# Patient Record
Sex: Female | Born: 1975
Health system: Southern US, Community
[De-identification: ages and names within clinical notes are randomized; demographics above are authoritative.]

## PROBLEM LIST (undated history)

## (undated) DIAGNOSIS — R569 Unspecified convulsions: Principal | ICD-10-CM

## (undated) DIAGNOSIS — Q282 Arteriovenous malformation of cerebral vessels: Secondary | ICD-10-CM

## (undated) DIAGNOSIS — M722 Plantar fascial fibromatosis: Secondary | ICD-10-CM

## (undated) HISTORY — DX: Arteriovenous malformation of cerebral vessels: Q28.2

## (undated) HISTORY — DX: Unspecified convulsions: R56.9

---

## 2001-05-11 ENCOUNTER — Ambulatory Visit (HOSPITAL_COMMUNITY): Admission: RE | Admit: 2001-05-11 | Discharge: 2001-05-11 | Payer: Self-pay | Admitting: Family Medicine

## 2001-06-27 ENCOUNTER — Encounter: Payer: Self-pay | Admitting: Neurology

## 2001-06-27 ENCOUNTER — Ambulatory Visit (HOSPITAL_COMMUNITY): Admission: RE | Admit: 2001-06-27 | Discharge: 2001-06-27 | Payer: Self-pay | Admitting: Neurology

## 2001-08-03 ENCOUNTER — Other Ambulatory Visit: Admission: RE | Admit: 2001-08-03 | Discharge: 2001-08-03 | Payer: Self-pay | Admitting: Family Medicine

## 2001-12-10 ENCOUNTER — Emergency Department (HOSPITAL_COMMUNITY): Admission: EM | Admit: 2001-12-10 | Discharge: 2001-12-10 | Payer: Self-pay | Admitting: Emergency Medicine

## 2002-07-13 HISTORY — PX: OTHER SURGICAL HISTORY: SHX169

## 2002-09-05 ENCOUNTER — Other Ambulatory Visit: Admission: RE | Admit: 2002-09-05 | Discharge: 2002-09-05 | Payer: Self-pay | Admitting: Obstetrics & Gynecology

## 2003-10-02 ENCOUNTER — Other Ambulatory Visit: Admission: RE | Admit: 2003-10-02 | Discharge: 2003-10-02 | Payer: Self-pay | Admitting: Obstetrics & Gynecology

## 2004-07-31 ENCOUNTER — Emergency Department (HOSPITAL_COMMUNITY): Admission: EM | Admit: 2004-07-31 | Discharge: 2004-07-31 | Payer: Self-pay | Admitting: Emergency Medicine

## 2004-09-28 ENCOUNTER — Emergency Department (HOSPITAL_COMMUNITY): Admission: EM | Admit: 2004-09-28 | Discharge: 2004-09-28 | Payer: Self-pay | Admitting: Emergency Medicine

## 2004-10-20 ENCOUNTER — Other Ambulatory Visit: Admission: RE | Admit: 2004-10-20 | Discharge: 2004-10-20 | Payer: Self-pay | Admitting: Obstetrics & Gynecology

## 2012-11-22 ENCOUNTER — Ambulatory Visit (INDEPENDENT_AMBULATORY_CARE_PROVIDER_SITE_OTHER): Payer: BC Managed Care – PPO | Admitting: Family Medicine

## 2012-11-22 ENCOUNTER — Encounter: Payer: Self-pay | Admitting: Family Medicine

## 2012-11-22 ENCOUNTER — Ambulatory Visit (INDEPENDENT_AMBULATORY_CARE_PROVIDER_SITE_OTHER): Payer: BC Managed Care – PPO

## 2012-11-22 VITALS — BP 110/69 | HR 83 | Temp 97.2°F | Ht 64.0 in | Wt 263.0 lb

## 2012-11-22 DIAGNOSIS — M25569 Pain in unspecified knee: Secondary | ICD-10-CM

## 2012-11-22 DIAGNOSIS — M25562 Pain in left knee: Secondary | ICD-10-CM

## 2012-11-22 DIAGNOSIS — R569 Unspecified convulsions: Secondary | ICD-10-CM

## 2012-11-22 NOTE — Patient Instructions (Addendum)
We will get an appointment with Dr. orthopedic to evaluate the left knee We will also get an appointment with the neurologist to evaluate the seizure disorder Only take Tylenol for pain Don't drive until seizure activity is further evaluated

## 2012-11-22 NOTE — Progress Notes (Signed)
Subjective:    Patient ID: Brandy Jefferson, female    DOB: 1976-06-07, 37 y.o.   MRN: [id number]  HPI  Patient has a history of seizures for 12 years or more. She was initially seen by a neurologist  in [place], who referred her to [facility] because of AVMs. According to the patient she had surgery x2. This was in 2002 and 2003. According to the patient she had no more seizures until the past 3 or 4 years. Initially she was having seizures every 2-3 months. Now she is having seizures every one to 5 days. They last for about 3 minutes at a time. They mainly affect her right side. The patient had recent lab work done at work. She is also complaining with left knee problems to the point where she could fall.   Review of Systems  Constitutional: Negative.   Eyes: Negative.   Respiratory: Negative.   Cardiovascular: Negative.   Genitourinary: Negative.   Neurological: Positive for dizziness (occasional dizzy), seizures (recurring, more frequently), numbness (R side) and headaches (once a week). Negative for speech difficulty.       Patient is having right-sided seizures now every one to 5 days lasting as long as 3 minute. She is aware when she is about to have a seizure and post the car over and doesn't drive. I have told her that she should not drive if she is having seizure.       Objective:   Physical Exam  Nursing note and vitals reviewed. Constitutional: She is oriented to person, place, and time. She appears well-developed and well-nourished. No distress.  HENT:  Head: Normocephalic and atraumatic.  Right Ear: External ear normal.  Left Ear: External ear normal.  Eyes: Conjunctivae are normal.  Neck: Normal range of motion. Neck supple. No thyromegaly present.  Cardiovascular: Normal rate, regular rhythm and normal heart sounds.   Pulmonary/Chest: Effort normal and breath sounds normal. No respiratory distress. She has no wheezes. She has no rales.  Abdominal: Soft. She exhibits mass.  She exhibits no distension. There is tenderness. There is no rebound and no guarding.  Obese, slight right lower quadrant tenderness  Musculoskeletal: She exhibits tenderness (bilateral joint lines left knee). She exhibits no edema.  Lymphadenopathy:    She has no cervical adenopathy.  Neurological: She is alert and oriented to person, place, and time. She has normal reflexes.  Skin: Skin is warm and dry.  Psychiatric: She has a normal mood and affect. Her behavior is normal. Judgment and thought content normal.   [facility] reading (PRIMARY) by  Dr. Christell Constant: Left Knee, WNL                                    Assessment & Plan:  1. Seizure-like activity - MR Brain W Wo Contrast - Ambulatory referral to Neurology  2. Left knee pain - DG Knee 1-2 Views Left - Ambulatory referral to Orthopedic Surgery  3. Severe obesity (BMI >= 40)   Patient Instructions  We will get an appointment with Dr. orthopedic to evaluate the left knee We will also get an appointment with the neurologist to evaluate the seizure disorder Only take Tylenol for pain Don't drive until seizure activity is further evaluated   Please bring. by lab work done at work when it becomes available to you. Use moist heat to knee to help control pain until appointment with orthopedist is obtained

## 2012-11-24 ENCOUNTER — Ambulatory Visit
Admission: RE | Admit: 2012-11-24 | Discharge: 2012-11-24 | Disposition: A | Discharge status: Home / Self Care | Payer: BC Managed Care – PPO | Source: Ambulatory Visit | Attending: Family Medicine | Admitting: Family Medicine

## 2012-11-25 ENCOUNTER — Telehealth: Payer: Self-pay | Admitting: *Deleted

## 2012-11-25 NOTE — Telephone Encounter (Signed)
Please do CT scan to confirm whether there is a clip are not and then get MRI

## 2012-11-25 NOTE — Telephone Encounter (Signed)
[facility] called and stated that pt had appt for an MRI of the brain on 5-15. While interviewing patient she stated that she had a clip put in to brain at [facility]. They held off on MRI until they could confirm with [facility]. After receiving records from them there was nothing stated about a clip. Per them pt should have a CT with or without contrast first. Please advise and order if approved.

## 2012-12-06 ENCOUNTER — Telehealth: Payer: Self-pay | Admitting: *Deleted

## 2012-12-06 ENCOUNTER — Telehealth: Payer: Self-pay | Admitting: Family Medicine

## 2012-12-06 NOTE — Telephone Encounter (Signed)
Spoke with Dr Clarisa Kindred office to get ASAP appt

## 2012-12-06 NOTE — Telephone Encounter (Signed)
Please advise kay

## 2012-12-06 NOTE — Telephone Encounter (Signed)
Spoke with Dr Clarisa Kindred nurse to get ASAP appt  Per [facility] radiology Dr Anne Hahn likes to schedule scans so will wait until appt with him

## 2012-12-07 ENCOUNTER — Ambulatory Visit (INDEPENDENT_AMBULATORY_CARE_PROVIDER_SITE_OTHER): Payer: BC Managed Care – PPO | Admitting: Neurology

## 2012-12-07 ENCOUNTER — Encounter: Payer: Self-pay | Admitting: Neurology

## 2012-12-07 VITALS — BP 115/70 | HR 77 | Ht 64.5 in | Wt 262.0 lb

## 2012-12-07 DIAGNOSIS — Z8679 Personal history of other diseases of the circulatory system: Secondary | ICD-10-CM

## 2012-12-07 DIAGNOSIS — R569 Unspecified convulsions: Secondary | ICD-10-CM

## 2012-12-07 DIAGNOSIS — Z8774 Personal history of (corrected) congenital malformations of heart and circulatory system: Secondary | ICD-10-CM | POA: Insufficient documentation

## 2012-12-07 HISTORY — DX: Unspecified convulsions: R56.9

## 2012-12-07 MED ORDER — LEVETIRACETAM 500 MG PO TABS: 500.0000 mg | ORAL_TABLET | Freq: Two times a day (BID) | ORAL | Status: DC

## 2012-12-07 NOTE — Progress Notes (Signed)
Reason for visit: Seizures  Brandy Jefferson is a 37 y.o. female  History of present illness:  Brandy Jefferson is a 37 year old right-handed white female with a history of a left temporal arteriovenous malformation that was ablated after 2 procedures, the last procedure was in 2003. The AVM was ablated through an intervascular procedure. The patient indicates that she is having seizure prior to the ablation, but after the procedure, the seizures went away. The patient was not on any medications for seizures after the procedure. Three or 4 years ago, the patient began having her usual sensory type seizure events involving numbness of the right arm and leg. The patient will have difficulty using the right side for several minutes, and then she will normalize. The patient has never blacked out, but she might become slightly confused. The patient has indicated that the seizures have become much more frequent, and the last seizure has occurred about 10 days ago. The patient still operates a motor vehicle, and she indicates that she will have to pull over to the side of the road for a seizure event. The patient feels normal between the seizures. The patient denies headache or any permanent numbness or weakness of the arms or legs. The patient denies any balance issues. The patient comes to this office for an evaluation.  Past Medical History  Diagnosis Date  . Partial seizure 12/07/2012  . Arteriovenous malformation of brain     Left temporal  . Seizures     Partial seizures, right hemisensory    Past Surgical History  Procedure Laterality Date  . Arteriovenous malformation ablation      Left temporal, intravascular procedure    Family History  Problem Relation Age of Onset  . Cancer Father     Social history:  reports that she has never smoked. She does not have any smokeless tobacco history on file. She reports that  drinks alcohol. Her drug history is not on file.  Medications:  Current  Outpatient Prescriptions on File Prior to Visit  Medication Sig Dispense Refill  . Norgestim-Eth Estrad Triphasic (ORTHO TRI-CYCLEN, 28, PO) Take 1 tablet by mouth daily.       No current facility-administered medications on file prior to visit.    Allergies: No Known Allergies  ROS:  Out of a complete 14 system review of symptoms, the patient complains only of the following symptoms, and all other reviewed systems are negative.  Skin rash, itching Weight gain Easy bruising Seizures  Blood pressure 115/70, pulse 77, height 5' 4.5" (1.638 m), weight 262 lb (118.842 kg).  Physical Exam  General: The patient is alert and cooperative at the time of the examination. The patient is markedly obese.  Head: Pupils are equal, round, and reactive to light. Discs are flat bilaterally.  Neck: The neck is supple, no carotid bruits are noted on the right, a bruit is noted on the left.  Respiratory: The respiratory examination is clear.  Cardiovascular: The cardiovascular examination reveals a regular rate and rhythm, no obvious murmurs or rubs are noted.  Skin: Extremities are without significant edema.  Neurologic Exam  Mental status:  Cranial nerves: Facial symmetry is present. There is good sensation of the face to pinprick and soft touch bilaterally. The strength of the facial muscles and the muscles to head turning and shoulder shrug are normal bilaterally. Speech is well enunciated, no aphasia or dysarthria is noted. Extraocular movements are full. Visual fields are full.  Motor: The motor testing reveals  5 over 5 strength of all 4 extremities. Good symmetric motor tone is noted throughout.  Sensory: Sensory testing is intact to pinprick, soft touch, vibration sensation, and position sense on all 4 extremities. No evidence of extinction is noted.  Coordination: Cerebellar testing reveals good finger-nose-finger and heel-to-shin bilaterally.  Gait and station: Gait is normal.  Tandem gait is normal. Romberg is negative. No drift is seen.  Reflexes: Deep tendon reflexes are symmetric and normal bilaterally. Toes are downgoing bilaterally.   Assessment/Plan:  1. Left temporal AVM, status post ablation  2. Partial seizures, right hemisensory  The patient has had ongoing seizures over the last several years that have become a bit more frequent over time. The patient is not on any antiepileptic medications. The patient has some impairment of the ability to function with the right side during the seizure, and some mild confusion. For this reason, I have recommended that she not drive until further notice. The patient will be set up for a CT scan of the brain with CT angiogram of the head and neck. The patient has a left carotid bruit that could be a high flow bruit, or a possible stenosis. The patient will be placed on Keppra. The patient will be set up for an EEG study. The patient will followup in 3 months.  Marlan Palau MD 37/28/2014 8:18 PM  [facility] [address] [place], [place] [address]  Phone [phone] Fax [phone]

## 2012-12-07 NOTE — Patient Instructions (Signed)
With the keppra, take the medication 500 mg, one half tablet twice a day for one week, then take one tablet twice a day

## 2012-12-19 ENCOUNTER — Institutional Professional Consult (permissible substitution): Payer: BC Managed Care – PPO | Admitting: Neurology

## 2012-12-26 ENCOUNTER — Ambulatory Visit (INDEPENDENT_AMBULATORY_CARE_PROVIDER_SITE_OTHER): Payer: BC Managed Care – PPO

## 2012-12-26 ENCOUNTER — Telehealth: Payer: Self-pay | Admitting: Neurology

## 2012-12-26 DIAGNOSIS — Z8774 Personal history of (corrected) congenital malformations of heart and circulatory system: Secondary | ICD-10-CM

## 2012-12-26 DIAGNOSIS — R569 Unspecified convulsions: Secondary | ICD-10-CM

## 2012-12-26 NOTE — Telephone Encounter (Signed)
I called patient. EEG was unremarkable.

## 2012-12-26 NOTE — Procedures (Signed)
  History:  Brandy Jefferson is a 37 year old patient with a left temporal arteriovenous malformation, status post ablation. The patient has had episodes of numbness sensations of the right arm and leg, and difficulty using the right side for several minutes. The patient is being evaluated for possible seizures.  This is a routine EEG. No skull defects are noted. Medications include Keppra and birth control pills.  EEG classification: Normal awake  Description of the recording: The background rhythms of this recording consists of a fairly well modulated medium amplitude alpha rhythm of 11 Hz that is reactive to eye opening and closure. As the record progresses, the patient appears to remain in the waking state throughout the recording. Photic stimulation was performed, resulting in a bilateral and symmetric photic driving response. Hyperventilation was also performed, resulting in a minimal buildup of the background rhythm activities without significant slowing seen. At no time during the recording does there appear to be evidence of spike or spike wave discharges or evidence of focal slowing. EKG monitor shows no evidence of cardiac rhythm abnormalities with a heart rate of 60.  Impression: This is a normal EEG recording in the waking state. No evidence of ictal or interictal discharges are seen.

## 2013-01-03 DIAGNOSIS — R569 Unspecified convulsions: Secondary | ICD-10-CM

## 2013-01-05 ENCOUNTER — Other Ambulatory Visit: Payer: Self-pay | Admitting: Diagnostic Neuroimaging

## 2013-01-05 DIAGNOSIS — R569 Unspecified convulsions: Secondary | ICD-10-CM

## 2013-01-05 DIAGNOSIS — Z8774 Personal history of (corrected) congenital malformations of heart and circulatory system: Secondary | ICD-10-CM

## 2013-01-06 ENCOUNTER — Telehealth: Payer: Self-pay | Admitting: Neurology

## 2013-01-06 NOTE — Telephone Encounter (Signed)
I called the patient and I left a message. I will call her back. CT the head shows a left anterior temporal AVM. The CTA results are not available.

## 2013-01-09 ENCOUNTER — Telehealth: Payer: Self-pay | Admitting: Neurology

## 2013-01-09 DIAGNOSIS — Q282 Arteriovenous malformation of cerebral vessels: Secondary | ICD-10-CM

## 2013-01-09 NOTE — Telephone Encounter (Signed)
I called patient. The CT of the head shows a moderate to large AVM with a large draining vein. The CT angiogram was not done. I will get an actual catheter angiogram through the hospital.

## 2013-01-27 ENCOUNTER — Ambulatory Visit (HOSPITAL_COMMUNITY)
Admission: RE | Admit: 2013-01-27 | Discharge: 2013-01-27 | Disposition: A | Discharge status: Home / Self Care | Payer: BC Managed Care – PPO | Source: Ambulatory Visit | Attending: Neurology | Admitting: Neurology

## 2013-01-27 DIAGNOSIS — Q282 Arteriovenous malformation of cerebral vessels: Secondary | ICD-10-CM

## 2013-02-10 ENCOUNTER — Telehealth (HOSPITAL_COMMUNITY): Payer: Self-pay | Admitting: Interventional Radiology

## 2013-02-10 ENCOUNTER — Other Ambulatory Visit (HOSPITAL_COMMUNITY): Payer: Self-pay | Admitting: Interventional Radiology

## 2013-02-10 DIAGNOSIS — Q2730 Arteriovenous malformation, site unspecified: Secondary | ICD-10-CM

## 2013-02-10 NOTE — Telephone Encounter (Signed)
Called pt left VM for her to call and schedule cath. Angio. JMichaux

## 2013-02-13 ENCOUNTER — Telehealth (HOSPITAL_COMMUNITY): Payer: Self-pay | Admitting: Interventional Radiology

## 2013-02-13 NOTE — Telephone Encounter (Signed)
Called pt left VM to call back to schedule appt JMichaux

## 2013-02-16 ENCOUNTER — Encounter (HOSPITAL_COMMUNITY): Payer: Self-pay | Admitting: Pharmacy Technician

## 2013-02-17 ENCOUNTER — Other Ambulatory Visit: Payer: Self-pay | Admitting: Radiology

## 2013-02-21 ENCOUNTER — Other Ambulatory Visit (HOSPITAL_COMMUNITY): Payer: Self-pay | Admitting: Interventional Radiology

## 2013-02-21 ENCOUNTER — Encounter (HOSPITAL_COMMUNITY): Payer: Self-pay

## 2013-02-21 ENCOUNTER — Ambulatory Visit (HOSPITAL_COMMUNITY)
Admission: RE | Admit: 2013-02-21 | Discharge: 2013-02-21 | Disposition: A | Discharge status: Home / Self Care | Payer: BC Managed Care – PPO | Source: Ambulatory Visit | Attending: Interventional Radiology | Admitting: Interventional Radiology

## 2013-02-21 VITALS — BP 115/68 | HR 77 | Temp 98.0°F | Resp 18 | Ht 65.0 in | Wt 262.0 lb

## 2013-02-21 DIAGNOSIS — I868 Varicose veins of other specified sites: Secondary | ICD-10-CM | POA: Insufficient documentation

## 2013-02-21 DIAGNOSIS — Q2730 Arteriovenous malformation, site unspecified: Secondary | ICD-10-CM

## 2013-02-21 DIAGNOSIS — Z79899 Other long term (current) drug therapy: Secondary | ICD-10-CM | POA: Insufficient documentation

## 2013-02-21 DIAGNOSIS — Q283 Other malformations of cerebral vessels: Secondary | ICD-10-CM | POA: Insufficient documentation

## 2013-02-21 DIAGNOSIS — R569 Unspecified convulsions: Secondary | ICD-10-CM | POA: Insufficient documentation

## 2013-02-21 LAB — CBC WITH DIFFERENTIAL/PLATELET
Basophils Absolute: 0.1 10*3/uL (ref 0.0–0.1)
Basophils Relative: 1 % (ref 0–1)
Eosinophils Absolute: 0.2 10*3/uL (ref 0.0–0.7)
MCH: 29.8 pg (ref 26.0–34.0)
MCHC: 34.4 g/dL (ref 30.0–36.0)
Monocytes Absolute: 0.5 10*3/uL (ref 0.1–1.0)
Neutro Abs: 3.8 10*3/uL (ref 1.7–7.7)
Neutrophils Relative %: 57 % (ref 43–77)
RDW: 13 % (ref 11.5–15.5)

## 2013-02-21 LAB — PROTIME-INR: INR: 1.02 (ref 0.00–1.49)

## 2013-02-21 LAB — BASIC METABOLIC PANEL
BUN: 10 mg/dL (ref 6–23)
Creatinine, Ser: 0.71 mg/dL (ref 0.50–1.10)
GFR calc Af Amer: >90 mL/min (ref >90)
GFR calc non Af Amer: >90 mL/min (ref >90)

## 2013-02-21 MED ORDER — SODIUM CHLORIDE 0.9 % IV BOLUS (SEPSIS)
INTRAVENOUS | Status: AC | PRN
  Administered 2013-02-21: 200 mL via INTRAVENOUS

## 2013-02-21 MED ORDER — SODIUM CHLORIDE 0.9 % IV SOLN
INTRAVENOUS | Status: AC | PRN
  Administered 2013-02-21: 100 mL/h via INTRAVENOUS

## 2013-02-21 MED ORDER — FENTANYL CITRATE 0.05 MG/ML IJ SOLN
INTRAMUSCULAR | Status: AC
  Filled 2013-02-21: qty 2

## 2013-02-21 MED ORDER — HEPARIN SOD (PORK) LOCK FLUSH 100 UNIT/ML IV SOLN
INTRAVENOUS | Status: AC | PRN
Start: 2013-02-21 — End: 2013-02-21
  Administered 2013-02-21: 500 [IU] via INTRAVENOUS

## 2013-02-21 MED ORDER — MIDAZOLAM HCL 2 MG/2ML IJ SOLN
INTRAMUSCULAR | Status: AC
  Filled 2013-02-21: qty 2

## 2013-02-21 MED ORDER — FENTANYL CITRATE 0.05 MG/ML IJ SOLN
INTRAMUSCULAR | Status: AC | PRN
  Administered 2013-02-21: 12.5 ug via INTRAVENOUS
  Administered 2013-02-21: 25 ug via INTRAVENOUS

## 2013-02-21 MED ORDER — SODIUM CHLORIDE 0.9 % IV SOLN
Freq: Once | INTRAVENOUS | Status: AC
  Administered 2013-02-21: 07:00:00 via INTRAVENOUS

## 2013-02-21 MED ORDER — MIDAZOLAM HCL 2 MG/2ML IJ SOLN
INTRAMUSCULAR | Status: AC | PRN
  Administered 2013-02-21: 0.5 mg via INTRAVENOUS

## 2013-02-21 MED ORDER — IOHEXOL 300 MG/ML  SOLN
150.0000 mL | Freq: Once | INTRAMUSCULAR | Status: AC | PRN
  Administered 2013-02-21: 90 mL via INTRA_ARTERIAL

## 2013-02-21 MED ORDER — SODIUM CHLORIDE 0.9 % IV SOLN: INTRAVENOUS | Status: AC

## 2013-02-21 NOTE — Procedures (Signed)
S/P 4 vesel cerebral arteriogram. LTCFA approach  Findings. 1Larege fast flow LT temporal AVM with arterial feeders from Lt MCA inf division ,and ant temp and Ant choroidal branches ,and from LT PCA  and P3 branches . Venous drainage into Lt int cerebral vein ,vein of Galen,Lt vein of Labbe,and Lt  Cortical veins to ant aspect of sup sagittal sinus. Large venous varix of prox Lt Int cerebral vein

## 2013-02-21 NOTE — H&P (Signed)
Brandy Jefferson is an 37 y.o. female.   Chief Complaint: Pt with Hx Left anterior Arteriovenous malformation Treated 2003 at [facility] with 2 staged embolizations Original sx was seizure activity which led to evaluation showing AVM. After embolizations pt was asymptomatic for yrs. Noticed recurrence of "staring" sz activity approx 2 yrs ago. Would happen very sporadically. Then 2 months ago sxs become more frequent; restarted Keppra and activity is minimal. Recent CT reveals probable recurrence of Left anterior AVM Scheduled now for cerebral arteriogram for evaluation HPI: Arteriovenous malformation; sz  Past Medical History  Diagnosis Date  . Partial seizure 12/07/2012  . Arteriovenous malformation of brain     Left temporal  . Seizures     Partial seizures, right hemisensory    Past Surgical History  Procedure Laterality Date  . Arteriovenous malformation ablation      Left temporal, intravascular procedure    Family History  Problem Relation Age of Onset  . Cancer Father    Social History:  reports that she has never smoked. She does not have any smokeless tobacco history on file. She reports that  drinks alcohol. Her drug history is not on file.  Allergies: No Known Allergies   (Not in a hospital admission)  Results for orders placed during the hospital encounter of 02/21/13 (from the past 48 hour(s))  APTT     Status: None   Collection Time    02/21/13  7:04 AM      Result Value Range   aPTT 26  24 - 37 seconds  CBC WITH DIFFERENTIAL     Status: None   Collection Time    02/21/13  7:04 AM      Result Value Range   WBC 6.6  4.0 - 10.5 K/uL   RBC 4.76  3.87 - 5.11 MIL/uL   Hemoglobin 14.2  12.0 - 15.0 g/dL   HCT 40.9  81.1 - 91.4 %   MCV 86.8  78.0 - 100.0 fL   MCH 29.8  26.0 - 34.0 pg   MCHC 34.4  30.0 - 36.0 g/dL   RDW 78.2  95.6 - 21.3 %   Platelets 323  150 - 400 K/uL   Neutrophils Relative % 57  43 - 77 %   Neutro Abs 3.8  1.7 - 7.7 K/uL   Lymphocytes  Relative 30  12 - 46 %   Lymphs Abs 2.0  0.7 - 4.0 K/uL   Monocytes Relative 8  3 - 12 %   Monocytes Absolute 0.5  0.1 - 1.0 K/uL   Eosinophils Relative 3  0 - 5 %   Eosinophils Absolute 0.2  0.0 - 0.7 K/uL   Basophils Relative 1  0 - 1 %   Basophils Absolute 0.1  0.0 - 0.1 K/uL  PROTIME-INR     Status: None   Collection Time    02/21/13  7:04 AM      Result Value Range   Prothrombin Time 13.2  11.6 - 15.2 seconds   INR 1.02  0.00 - 1.49   No results found.  Review of Systems  Constitutional: Negative for fever and weight loss.  Respiratory: Negative for shortness of breath.   Cardiovascular: Negative for chest pain.  Gastrointestinal: Negative for nausea, vomiting and abdominal pain.  Musculoskeletal: Negative for falls.  Neurological: Positive for seizures. Negative for weakness and headaches.    Blood pressure 127/63, pulse 71, temperature 98 F (36.7 C), temperature source Oral, resp. rate 18, height 5\' 5"  (  1.651 m), weight 262 lb (118.842 kg), last menstrual period 02/09/2013, SpO2 100.00%. Physical Exam  Constitutional: She is oriented to person, place, and time. She appears well-nourished.  Cardiovascular: Normal rate, regular rhythm and normal heart sounds.   No murmur heard. Respiratory: Effort normal and breath sounds normal. She has no wheezes.  GI: Soft. Bowel sounds are normal. There is no tenderness.  Musculoskeletal: Normal range of motion.  Neurological: She is alert and oriented to person, place, and time. Coordination normal.  Skin: Skin is warm and dry.  Psychiatric: She has a normal mood and affect. Her behavior is normal. Judgment and thought content normal.     Assessment/Plan Hx L anterior AVM- post 2 staged embolizations 2003- [facility] sz activity subsided til 2 yrs ago--very sporadic sxs x 2 months more frequent- back on Keppra so seizures are minimal. Recent CT shows prob recurrence of AVM Scheduled now for cerebral arteriogram Pt aware of procedure  benefits and risks and agreeable to proceed Consent signed and in chart  [name] A 02/21/2013, 7:37 AM

## 2013-02-21 NOTE — Progress Notes (Signed)
Bilat. Groin dressings CDI without bleed/hematoma; no c/o

## 2013-02-21 NOTE — ED Notes (Signed)
Dr. Corliss Skains unable to obtain access in right groin, likely due to scar tissue, pressure being held with V-Pad.

## 2013-02-21 NOTE — ED Notes (Signed)
55fr sheath inserted into left groin

## 2013-03-01 ENCOUNTER — Telehealth (HOSPITAL_COMMUNITY): Payer: Self-pay | Admitting: Interventional Radiology

## 2013-03-01 NOTE — Telephone Encounter (Signed)
Pt called to see what her next step would be with Dr. Corliss Skains and to report that her Rt groin was still bruised and has a "knot" in it. I told her I would speak to Dr. Corliss Skains and call her back. She is in agreement with that. JMichaux

## 2013-03-10 ENCOUNTER — Encounter: Payer: Self-pay | Admitting: Nurse Practitioner

## 2013-03-10 ENCOUNTER — Ambulatory Visit (INDEPENDENT_AMBULATORY_CARE_PROVIDER_SITE_OTHER): Payer: BC Managed Care – PPO | Admitting: Nurse Practitioner

## 2013-03-10 VITALS — BP 112/69 | HR 60 | Ht 65.0 in | Wt 260.0 lb

## 2013-03-10 DIAGNOSIS — R569 Unspecified convulsions: Secondary | ICD-10-CM

## 2013-03-10 DIAGNOSIS — Z8774 Personal history of (corrected) congenital malformations of heart and circulatory system: Secondary | ICD-10-CM

## 2013-03-10 DIAGNOSIS — Z8679 Personal history of other diseases of the circulatory system: Secondary | ICD-10-CM

## 2013-03-10 MED ORDER — LEVETIRACETAM 500 MG PO TABS: ORAL_TABLET | ORAL | Status: DC

## 2013-03-10 NOTE — Progress Notes (Signed)
I have read the note, and I agree with the clinical assessment and plan.  Dilyn Smiles KEITH   

## 2013-03-10 NOTE — Patient Instructions (Addendum)
Please increase Keppra to 500mg  am and (2) 500mg  tabs at night.  Continue to keep record of seizure activity Call me in 2 weeks about continued seizure activity F/U in 3 months

## 2013-03-10 NOTE — Progress Notes (Signed)
Reason for visit followup for seizure activity HPI: Brandy Jefferson, 37 yr old returns for followup. She was initially evaluated by Dr. Anne Hahn 12/07/12 for a history of a left temporal AVM that was ablated after 2 procedures The last procedure was in 2003. The AVM was ablated through an intervascular procedure. The patient indicates that she is having seizure prior to the ablation, but after the procedure, the seizures went away. The patient was not on any medications for seizures after the procedure. Three or 4 years ago, the patient began having her usual sensory type seizure events involving numbness of the right arm and leg. The patient will have difficulty using the right side for several minutes, and then she will normalize. The patient has never blacked out, but she might become slightly confused. The patient has indicated that the seizures have become much more frequent, and the last seizure has occurred about 10 days ago. The patient still operates a motor vehicle, and she indicates that she will have to pull over to the side of the road for a seizure event. The patient feels normal between the seizures. The patient denies headache or any permanent numbness or weakness of the arms or legs.   03/10/13; Returns for follow up visit. She has kept a record of her seizure activity and she had 3 in June and July and 4 for August. She has numbness of the right arm and leg, she is aware of her surroundings but cannot communicate, and may be confused. They usually last less than one minute. EEG performed in our office 12/26/12 was normal. She had bil common carotid  and vertebral artery angiogram by Dr.Deveshwar on August 12. She is to be scheduled for repeat MRI she says.   ROS:  Out of  a complete 14 system review, the patient complains only of the following symptoms and all other review of systems are negative. Seizure and headache   Medications Current Outpatient Prescriptions on File Prior to Visit   Medication Sig Dispense Refill  . acetaminophen (TYLENOL) 325 MG tablet Take 325-1,300 mg by mouth every 6 (six) hours as needed for pain.       Lorita Officer Triphasic (ORTHO TRI-CYCLEN, 28, PO) Take 1 tablet by mouth daily.       No current facility-administered medications on file prior to visit.    Allergies No Known Allergies  Physical Exam General: well developed, obese female  seated, in no evident distress Head: head normocephalic and atraumatic. Oropharynx benign Neck: supple with left  carotid  bruit Cardiovascular: regular rate and rhythm, no murmurs  Neurologic Exam Mental Status: Awake and fully alert. Oriented to place and time. Follows all commands. Speech and language normal.   Cranial Nerves:  Pupils equal, briskly reactive to light. Extraocular movements full without nystagmus. Visual fields full to confrontation. Hearing intact and symmetric to finger snap. Facial sensation intact. Face, tongue, palate move normally and symmetrically. Neck flexion and extension normal.  Motor: Normal bulk and tone. Normal strength in all tested extremity muscles.No focal weakness Sensory.: intact to touch and pinprick and vibratory.  Coordination: Rapid alternating movements normal in all extremities. Finger-to-nose and heel-to-shin performed accurately bilaterally. No dysmetria Gait and Station: Arises from chair without difficulty. Stance is normal. Gait demonstrates normal stride length and balance . Able to heel, toe and tandem walk without difficulty.  Reflexes: 2+ and symmetric. Toes downgoing.     ASSESSMENT: Left temporal AVM status post ablation Partial seizures, right hemisensory currently on  Keppra     PLAN: Please increase Keppra to 500mg  am and (2) 500mg  tabs at night.  Continue to keep record of seizure activity Call me in 2 weeks about continued seizure activity F/U in 3 months  Nilda Riggs, GNP-BC APRN

## 2013-03-17 ENCOUNTER — Telehealth (HOSPITAL_COMMUNITY): Payer: Self-pay | Admitting: Interventional Radiology

## 2013-03-17 NOTE — Telephone Encounter (Signed)
Called pt left VM for her to call and schedule consult JMichaux

## 2013-06-14 ENCOUNTER — Encounter: Payer: Self-pay | Admitting: Nurse Practitioner

## 2013-06-14 ENCOUNTER — Ambulatory Visit (INDEPENDENT_AMBULATORY_CARE_PROVIDER_SITE_OTHER): Payer: BC Managed Care – PPO | Admitting: Nurse Practitioner

## 2013-06-14 VITALS — BP 105/60 | HR 65 | Ht 65.0 in | Wt 252.0 lb

## 2013-06-14 DIAGNOSIS — Z8679 Personal history of other diseases of the circulatory system: Secondary | ICD-10-CM

## 2013-06-14 DIAGNOSIS — Z8774 Personal history of (corrected) congenital malformations of heart and circulatory system: Secondary | ICD-10-CM

## 2013-06-14 DIAGNOSIS — R569 Unspecified convulsions: Secondary | ICD-10-CM

## 2013-06-14 MED ORDER — LEVETIRACETAM 500 MG PO TABS: ORAL_TABLET | ORAL | Status: DC

## 2013-06-14 NOTE — Progress Notes (Signed)
I have read the note, and I agree with the clinical assessment and plan.  Alphus Zeck KEITH   

## 2013-06-14 NOTE — Patient Instructions (Signed)
Continue Keppra at current dose will refill for 6 months Call for increase in seizure activity Followup in 6 months

## 2013-06-14 NOTE — Progress Notes (Signed)
[facility]  PATIENT: Brandy Jefferson DOB: November 02, 1975   REASON FOR VISIT: Followup for seizure disorder    HISTORY OF PRESENT ILLNESS:Brandy Jefferson, 37 year old female returns for followup. She was last seen 03/10/2013. At that time she has kept a record of her seizure activity and her Keppra dose was increased to 1500 mg total twice daily. Since that time she has continued to keep a record of her seizure activity. She had 2 seizures in October, her birth control was changed on 04/30/2013 and she has had one seizure since that time. She has not followed up with Dr. Corliss Skains. She denies any side effects to her Keppra.   HISTORY: of a left temporal AVM that was ablated after 2 procedures The last procedure was in 2003. The AVM was ablated through an intervascular procedure. The patient indicates that she is having seizure prior to the ablation, but after the procedure, the seizures went away. The patient was not on any medications for seizures after the procedure. Three or 4 years ago, the patient began having her usual sensory type seizure events involving numbness of the right arm and leg. The patient will have difficulty using the right side for several minutes, and then she will normalize. The patient has never blacked out, but she might become slightly confused. The patient has indicated that the seizures have become much more frequent, and the last seizure has occurred about 10 days ago. The patient still operates a motor vehicle, and she indicates that she will have to pull over to the side of the road for a seizure event. The patient feels normal between the seizures. The patient denies headache or any permanent numbness or weakness of the arms or legs.  03/10/13; Returns for follow up visit. She has kept a record of her seizure activity and she had 3 in June and July and 4 for August. She has numbness of the right arm and leg, she is aware of her surroundings but cannot  communicate, and may be confused. They usually last less than one minute. EEG performed in our office 12/26/12 was normal. She had bil common carotid and vertebral artery angiogram by Dr.Deveshwar on August 12. She is to be scheduled for repeat MRI she says.    REVIEW OF SYSTEMS: Full 14 system review of systems performed and notable only for:  Constitutional: N/A  Cardiovascular: N/A  Ear/Nose/Throat: N/A  Skin: N/A  Eyes: N/A  Respiratory: N/A  Gastroitestinal: N/A  Hematology/Lymphatic: N/A  Endocrine: N/A Musculoskeletal:N/A  Allergy/Immunology: N/A  Neurological: Seizure Psychiatric: N/A   ALLERGIES: No Known Allergies  HOME MEDICATIONS: Outpatient Prescriptions Prior to Visit  Medication Sig Dispense Refill  . acetaminophen (TYLENOL) 325 MG tablet Take 325-1,300 mg by mouth every 6 (six) hours as needed for pain.       Marland Kitchen levETIRAcetam (KEPPRA) 500 MG tablet 1 am and 2 at hs  90 tablet  3  . Norgestim-Eth Estrad Triphasic (ORTHO TRI-CYCLEN, 28, PO) Take 1 tablet by mouth daily.       No facility-administered medications prior to visit.    PAST MEDICAL HISTORY: Past Medical History  Diagnosis Date  . Partial seizure 12/07/2012  . Arteriovenous malformation of brain     Left temporal  . Seizures     Partial seizures, right hemisensory    PAST SURGICAL HISTORY: Past Surgical History  Procedure Laterality Date  . Arteriovenous malformation ablation      Left temporal, intravascular procedure    FAMILY  HISTORY: Family History  Problem Relation Age of Onset  . Cancer Father     SOCIAL HISTORY: History   Social History  . Marital Status: Single    Spouse Name: N/A    Number of Children: 1  . Years of Education: 12   Occupational History  .  [employer]   Social History Main Topics  . Smoking status: Never Smoker   . Smokeless tobacco: Never Used  . Alcohol Use: Yes     Comment: Occasional  . Drug Use: No  . Sexual Activity: Not on file    Other Topics Concern  . Not on file   Social History Narrative   Patient lives at home with daughter and her daughters dad.    Patient is not married.    Patient has 1 child.    Patient is currently working.    Patient drinks caffeine and the amounts varies.            PHYSICAL EXAM  Filed Vitals:   06/14/13 1027  BP: 105/60  Pulse: 65  Height: 5\' 5"  (1.651 m)  Weight: 252 lb (114.306 kg)   Body mass index is 41.93 kg/(m^2).  Generalized: Well developed, in no acute distress  Head: normocephalic and atraumatic,. Oropharynx benign  Neck: Supple, no carotid bruits  Cardiac: Regular rate rhythm, no murmur  Neurological examination   Mentation: Alert oriented to time, place, history taking. Follows all commands speech and language fluent  Cranial nerve II-XII: Pupils were equal round reactive to light extraocular movements were full, visual field were full on confrontational test. Facial sensation and strength were normal. hearing was intact to finger rubbing bilaterally. Uvula tongue midline. head turning and shoulder shrug and were normal and symmetric.Tongue protrusion into cheek strength was normal. Motor: normal bulk and tone, full strength in the BUE, BLE, fine finger movements normal, no pronator drift. No focal weakness Sensory: normal and symmetric to light touch, pinprick, and  vibration  Coordination: finger-nose-finger, heel-to-shin bilaterally, no dysmetria Reflexes: Brachioradialis 2/2, biceps 2/2, triceps 2/2, patellar 2/2, Achilles 2/2, plantar responses were flexor bilaterally. Gait and Station: Rising up from seated position without assistance, normal stance, moderate stride, good arm swing, smooth turning, able to perform tiptoe, and heel walking without difficulty.   DIAGNOSTIC DATA (LABS, IMAGING, TESTING) - I reviewed patient records, labs, notes, testing and imaging myself where available.  Lab Results  Component Value Date   WBC 6.6 02/21/2013    HGB 14.2 02/21/2013   HCT 41.3 02/21/2013   MCV 86.8 02/21/2013   PLT 323 02/21/2013      Component Value Date/Time   NA 139 02/21/2013 0704   K 3.6 02/21/2013 0704   CL 105 02/21/2013 0704   CO2 25 02/21/2013 0704   GLUCOSE 92 02/21/2013 0704   BUN 10 02/21/2013 0704   CREATININE 0.71 02/21/2013 0704   CALCIUM 9.0 02/21/2013 0704   GFRNONAA >90 02/21/2013 0704   GFRAA >90 02/21/2013 0704     ASSESSMENT AND PLAN  37 y.o. year old female  has a past medical history of Partial seizure (12/07/2012); Arteriovenous malformation of brain; and Seizures. here for followup. She has kept a record of her seizures. She had a change in her birth control on 04/30/13 with 1 seizure in 7 weeks which is an improvement. She is currently on Keppra 1500 mg total dose. She has not called for her followup appointment with Dr. Corliss Skains.  Continue Keppra at current dose will refill for 6  months Call for increase in seizure activity Followup in 6 months Nilda Riggs, [facility], [facility], APRN  [facility] [address] [place], [place] [address] [phone]

## 2013-07-19 ENCOUNTER — Other Ambulatory Visit: Payer: Self-pay | Admitting: Nurse Practitioner

## 2013-07-19 MED ORDER — OSELTAMIVIR PHOSPHATE 75 MG PO CAPS: 75.0000 mg | ORAL_CAPSULE | Freq: Every day | ORAL | Status: DC

## 2013-09-11 IMAGING — XA IR ANGIO INTRA EXTRACRAN SEL COM CAROTID INNOMINATE BILAT MOD SE
2 of 4 series · 11 of 24 positions shown · IV contrast (IODINE)
Comparison: Catheter angiogram of 06/27/2001.

CLINICAL DATA: History of left cerebral hemispheric AM first
diagnosed in 9440.  Subsequently treated with multiple
embolizations.  The patient presents with headaches and increased
frequency of seizures.

BILATERAL COMMON CAROTID ARTERY AND BILATERAL VERTEBRAL ARTERY
ANGIOGRAMS

[Series 7: vertebral 1 · 1 of 4 slices shown]
[im 1/4]
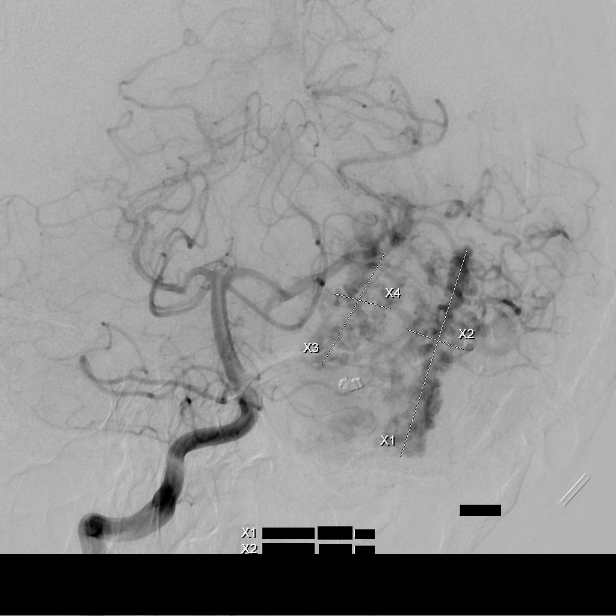

[Series 300: neuro · 10 of 203 slices shown]
[im 1/203]
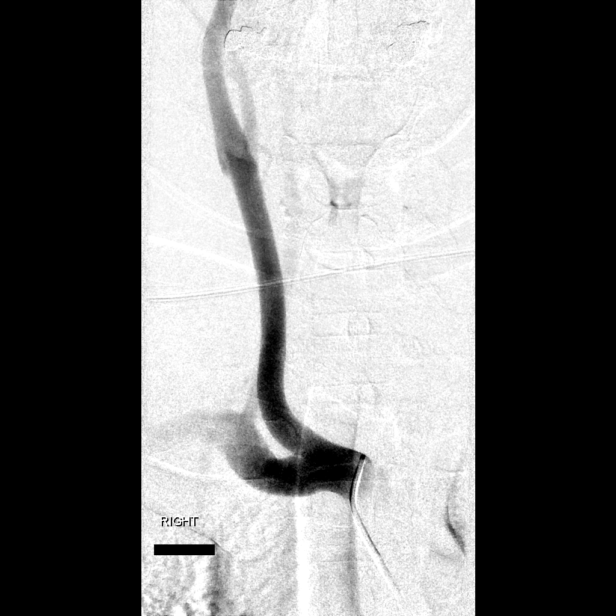
[im 21/203]
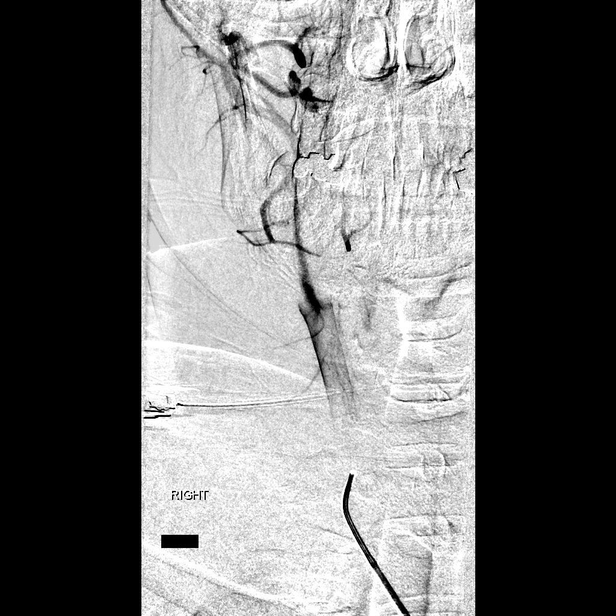
[im 41/203]
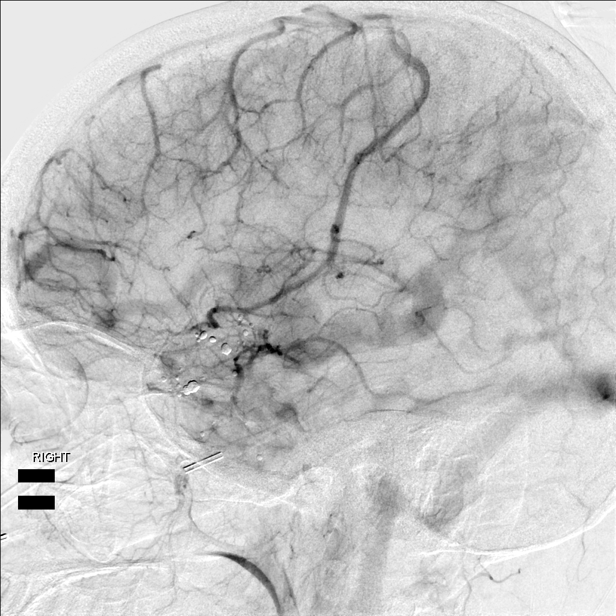
[im 61/203]
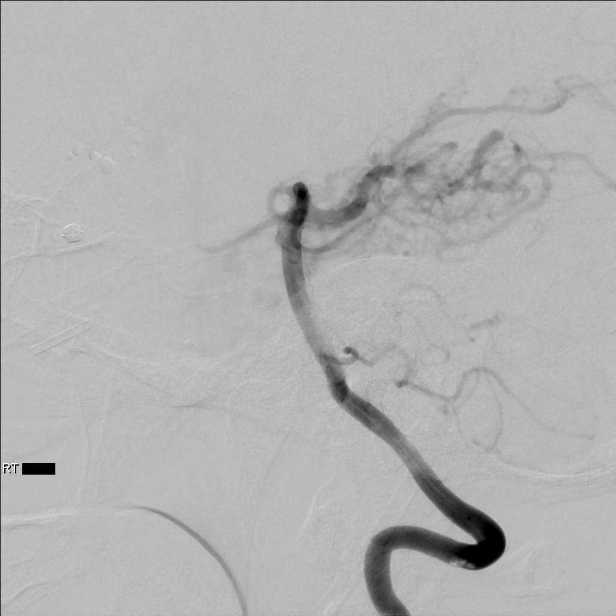
[im 91/203]
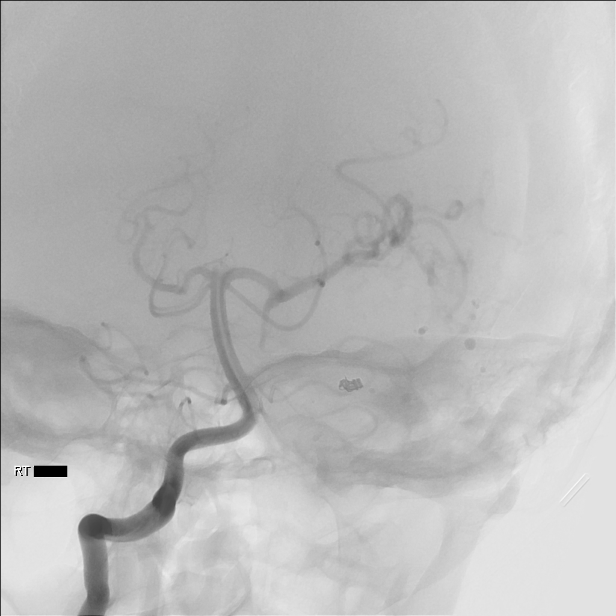
[im 112/203]
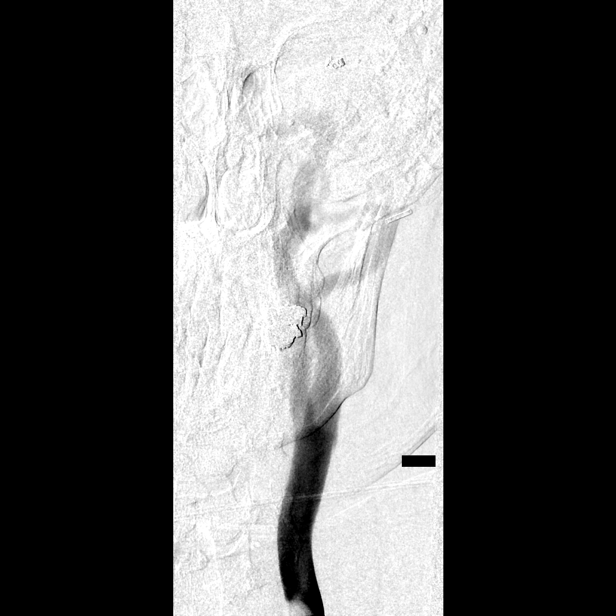
[im 132/203]
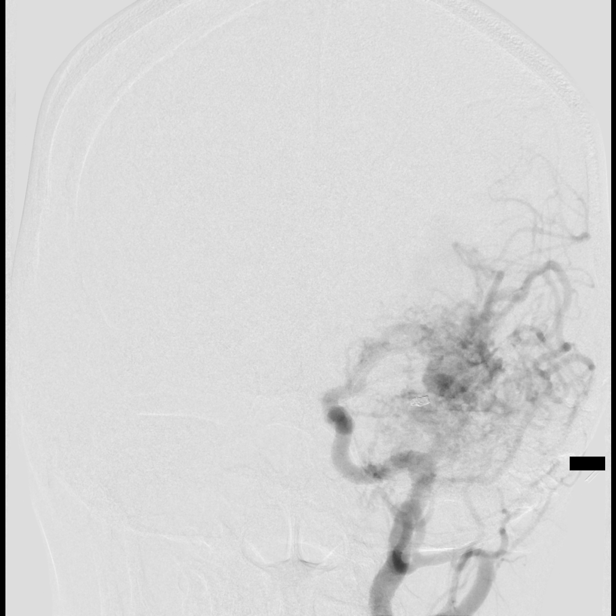
[im 152/203]
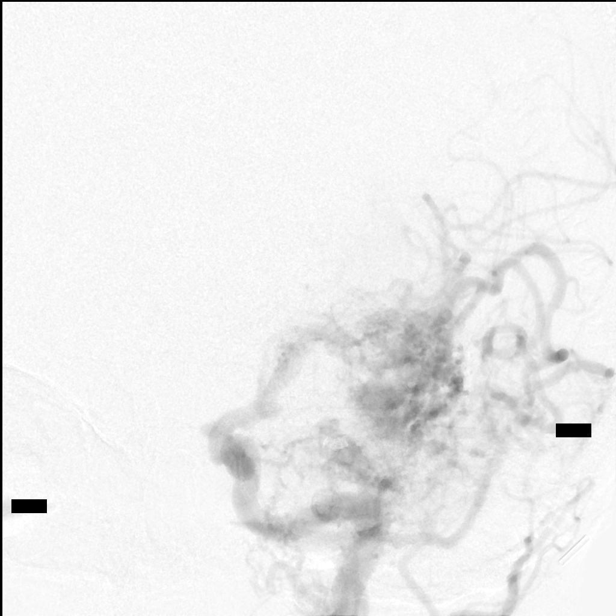
[im 172/203]
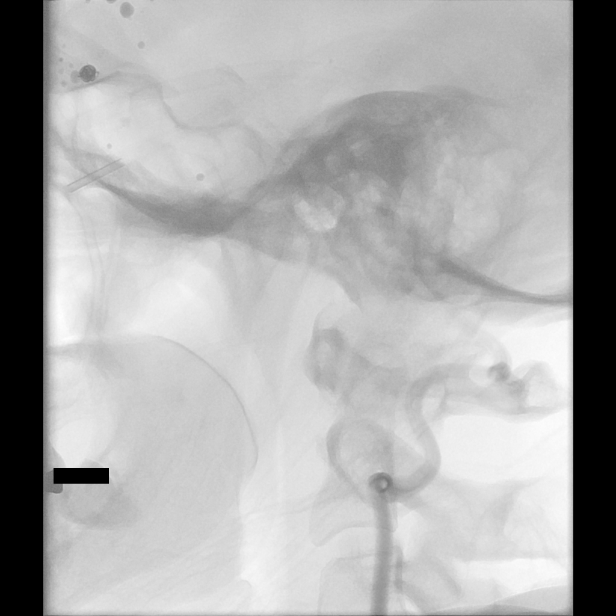
[im 192/203]
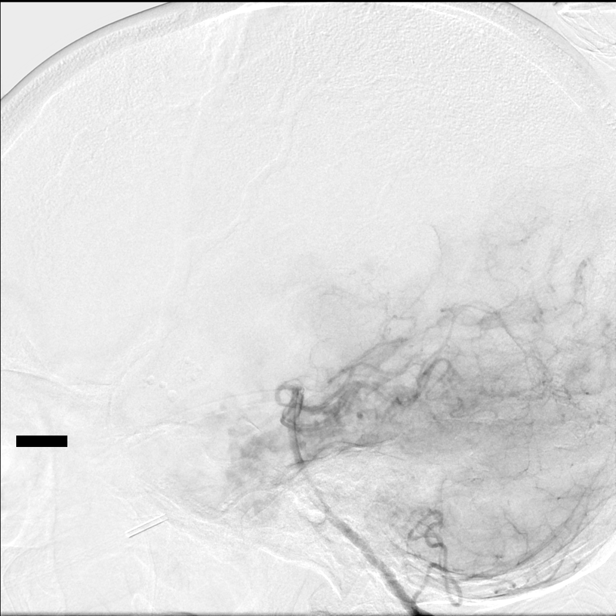

[11 of 24 positions shown; findings below may reference images not displayed]

Following a full explanation of the procedure along with the
potential associated complications, an informed witnessed consent
was obtained.

The left groin was prepped and draped in the usual sterile fashion.
Thereafter using modified Seldinger technique, transfemoral access
into the left common carotid artery was obtained without
difficulty.  Over a 0.035" guidewire, a 5-French Pinnacle sheath
was inserted.  Through this and also over a 0.035" guidewire, a 5-
French JB1 catheter was advanced to the aortic arch region and
selectively positioned in the right common carotid artery, the
right vertebral artery, the left common carotid artery and the left
vertebral artery.

There were no acute complications.  The patient tolerated the
procedure well.

Medications utilized:  Versed 0.5 mg IV.  Fentanyl 37.5 mcg.

Contrast utilized:  Hmnipaque-P22 approximately 55 ml.
FINDINGS: The right common carotid arteriogram demonstrates the
right external carotid artery and its major branches to be normal.

The right internal artery at the bulb to the cranial skull base
opacifies normally.

The petrous, the cavernous and the supraclinoid segments are
normal.  The right middle and the right anterior cerebral arteries
opacify normally into the capillary and venous phases.

Prompt opacification via the anterior communicating artery of the
left anterior cerebral artery and of the left middle cerebral
artery retrogradely is seen.  There is almost immediate
opacification of the left temporal nidus of an arteriovenous
malformation with subsequent opacification of venous drainage
superiorly and medially and subsequently into the left internal
cerebral vein to be described in more detail later.

The right vertebral artery origin is normal.

The vessel opacifies normally to the cranial skull base.

There is normal opacification of the right posterior inferior
cerebellar artery and the right vertebrobasilar junction.

The basilar artery, the right posterior cerebral artery, the
superior cerebellar arteries and the anterior inferior cerebellar
arteries opacify normally into the capillary and venous phases.
Unopacified blood is seen in the basilar artery from the
contralateral vertebral artery.

An abnormally prominent left posterior cerebral artery is seen with
subsequent opacification from the left P3 arterial branches of a
prominent nidus involving the left temporal lobe which extends
anteroposteriorly and mediolaterally.

The nidus on this injection measures approximately 4.6 cm by
cm.

This is subsequently followed by the opacification anteriorly from
the nidus and laterally of a single vein along the cortical surface
supplying the superior sagittal sinus, a prominent vein along the
midsegment of the nidus projecting superiorly, and associated with
a large venous varix.  There is subsequent opacification into the
prominent left internal cerebral vein, the vein of Oldis Durres and the
straight sinus.

Also demonstrated is opacification into the left vein of Labbe.

The left common carotid arteriogram demonstrates the left external
carotid artery and its major branches to be normal.

The left internal carotid artery at the bulb to the cranial skull
base opacifies normally.

The petrous, cavernous and the supraclinoid segments are normal.

A prominent anterior choroidal artery is seen opacifying the
inferior aspect of a nidus of the midportion of that the temporal
arteriovenous malformation.

There is no visualization of the left anterior cerebral.

Opacification is seen of the left middle cerebral artery with
abnormal prominence of the inferior division and the anterior
temporal branch which give rise to prominent arterial feeders
supplying the lateral and the superior aspect of the nidus of the
arteriovenous malformation.

The superior division of the left middle cerebral artery appears
hypoplastic on account of the shunting into the nidus.

Again demonstrated is the prominent venous flow into the venous
varix along the superior aspect of the nidus of the arteriovenous
malformation with subsequent opacification medially and centrally
into a large left internal cerebral vein and subsequently the vein
of Oldis Durres and the straight sinus.

Also seen is opacification from the inferior aspect of the nidus
along the cortical surface into the vein of Labbe.  A prominent
venous channel is again noted along the anterior lateral aspect of
the arteriovenous malformation into the anterior third of the
superior sagittal sinus.

The large venous varix measures approximately 3 cm by 18 mm.

The nidus on the lateral projection measures 4.8 cm x 2.9 cm, and
on the AP projection measures approximately 3.9 cm x 4.6 cm.

The left vertebral artery origin is normal.  The vessel opacifies
normally to the cranial skull base.

There is normal opacification of the left posterior inferior
cerebellar artery and the left vertebrobasilar junction.

The opacified portion of the basilar artery, the right posterior
cerebral artery, the superior cerebellar arteries and the anterior
inferior cerebellar arteries is normally into capillary and venous
phases.

Transient opacification of a prominent left posterior cerebral
artery is seen with opacification of the nidus of the arteriovenous
malformation in the left temporal lobe region, from the left PCA P3
arterial branches.

IMPRESSION
1.  Large fast flow left temporal lobe arteriovenous malformation
with a nidus measuring 4.8 cm x 2.9 cm in the lateral projection,
and 3.9 cm by 4.5 cm in the AP projection.
2.  Primary arterial features from the left posterior cerebral
artery P3 branches, and the anterior temporal branch, and the
inferior division of the left middle cerebral artery, and to a
lesser degree from the left anterior choroidal artery.
3.  Primary venous outflow via a large vein emanating from the
superior aspect of the nidus of the arteriovenous malformation
associated with a large venous varix with subsequent opacification
into a large left internal cerebral vein, the vein of Oldis Durres and
subsequently the straight sinus, inferiorly via a prominent branch
feeding into the vein of Labbe on the left, and anteriorly and
superiorly via a cortical branch draining into the anterior third
of the superior sagittal sinus.
4.  No associated perinidal, intranidal or intracranial aneurysms
seen on this study.
5.  Compared to the study of [DATE], there is suggestion of a
patchy mild decrease in the nidus of the arteriovenous
malformation, with however associated increased prominence of the
venous varix along the superior mid segment of the arteriovenous
malformation as described above.

## 2013-12-13 ENCOUNTER — Encounter (INDEPENDENT_AMBULATORY_CARE_PROVIDER_SITE_OTHER): Payer: Self-pay

## 2013-12-13 ENCOUNTER — Encounter: Payer: Self-pay | Admitting: Nurse Practitioner

## 2013-12-13 ENCOUNTER — Ambulatory Visit (INDEPENDENT_AMBULATORY_CARE_PROVIDER_SITE_OTHER): Payer: BC Managed Care – PPO | Admitting: Nurse Practitioner

## 2013-12-13 VITALS — BP 103/66 | HR 56 | Ht 64.5 in | Wt 243.0 lb

## 2013-12-13 DIAGNOSIS — R569 Unspecified convulsions: Secondary | ICD-10-CM

## 2013-12-13 DIAGNOSIS — Z8774 Personal history of (corrected) congenital malformations of heart and circulatory system: Secondary | ICD-10-CM

## 2013-12-13 DIAGNOSIS — Z8679 Personal history of other diseases of the circulatory system: Secondary | ICD-10-CM

## 2013-12-13 MED ORDER — LEVETIRACETAM 500 MG PO TABS: ORAL_TABLET | ORAL | Status: DC

## 2013-12-13 NOTE — Progress Notes (Signed)
[facility]  PATIENT: Brandy Jefferson DOB: 09-04-1975   REASON FOR VISIT: Followup for seizure disorder   HISTORY OF PRESENT ILLNESS: Brandy Jefferson, 38 year old female returns for followup. She was last seen in the office 06/14/2013. She has history of seizure disorder. Last seizure activity was 04/30/2013. She is currently on Keppra 1500 mg total twice daily. She denies any side effects to her Keppra. She returns for reevaluation.   HISTORY: of a left temporal AVM that was ablated after 2 procedures The last procedure was in 2003. The AVM was ablated through an intervascular procedure. The patient indicates that she is having seizure prior to the ablation, but after the procedure, the seizures went away. The patient was not on any medications for seizures after the procedure. Three or 4 years ago, the patient began having her usual sensory type seizure events involving numbness of the right arm and leg. The patient will have difficulty using the right side for several minutes, and then she will normalize. The patient has never blacked out, but she might become slightly confused. The patient has indicated that the seizures have become much more frequent, and the last seizure has occurred about 10 days ago. The patient still operates a motor vehicle, and she indicates that she will have to pull over to the side of the road for a seizure event. The patient feels normal between the seizures. The patient denies headache or any permanent numbness or weakness of the arms or legs.  03/10/13; Returns for follow up visit. She has kept a record of her seizure activity and she had 3 in June and July and 4 for August. She has numbness of the right arm and leg, she is aware of her surroundings but cannot communicate, and may be confused. They usually last less than one minute. EEG performed in our office 12/26/12 was normal. She had bil common carotid and vertebral artery angiogram by Dr.Deveshwar  on August 12. She is to be scheduled for repeat MRI she says.    REVIEW OF SYSTEMS: Full 14 system review of systems performed and notable only for those listed, all others are neg:  Constitutional: N/A  Cardiovascular: N/A  Ear/Nose/Throat: N/A  Skin: N/A  Eyes: N/A  Respiratory: N/A  Gastroitestinal: N/A  Hematology/Lymphatic: N/A  Endocrine: N/A Musculoskeletal: Knee pain , arthritis Allergy/Immunology: N/A  Neurological: N/A Psychiatric: N/A Sleep : NA   ALLERGIES: No Known Allergies  HOME MEDICATIONS: Outpatient Prescriptions Prior to Visit  Medication Sig Dispense Refill  . acetaminophen (TYLENOL) 325 MG tablet Take 325-1,300 mg by mouth every 6 (six) hours as needed for pain.       Marland Kitchen. levETIRAcetam (KEPPRA) 500 MG tablet 1 am and 2 at hs  90 tablet  6  . norgestimate-ethinyl estradiol (ORTHO-CYCLEN,SPRINTEC,PREVIFEM) 0.25-35 MG-MCG tablet Take 1 tablet by mouth daily.      Marland Kitchen. oseltamivir (TAMIFLU) 75 MG capsule Take 1 capsule (75 mg total) by mouth daily.  10 capsule  0   No facility-administered medications prior to visit.    PAST MEDICAL HISTORY: Past Medical History  Diagnosis Date  . Partial seizure 12/07/2012  . Arteriovenous malformation of brain     Left temporal  . Seizures     Partial seizures, right hemisensory    PAST SURGICAL HISTORY: Past Surgical History  Procedure Laterality Date  . Arteriovenous malformation ablation      Left temporal, intravascular procedure    FAMILY HISTORY: Family History  Problem Relation Age of  Onset  . Cancer Father     SOCIAL HISTORY: History   Social History  . Marital Status: Single    Spouse Name: N/A    Number of Children: 1  . Years of Education: 12   Occupational History  .  [employer]   Social History Main Topics  . Smoking status: Never Smoker   . Smokeless tobacco: Never Used  . Alcohol Use: Yes     Comment: Occasional  . Drug Use: No  . Sexual Activity: Not on file   Other Topics  Concern  . Not on file   Social History Narrative   Patient lives at home with daughter and her daughters dad.    Patient is not married.    Patient has 1 child.    Patient is currently working.    Patient drinks caffeine and the amounts varies.            PHYSICAL EXAM  Filed Vitals:   12/13/13 1405  BP: 103/66  Pulse: 56  Height: 5' 4.5" (1.638 m)  Weight: 243 lb (110.224 kg)   Body mass index is 41.08 kg/(m^2).  Generalized: Well developed, obese female in no acute distress    Neurological examination   Mentation: Alert oriented to time, place, history taking. Follows all commands speech and language fluent Cranial nerve II-XII: Pupils were equal round reactive to light extraocular movements were full, visual field were full on confrontational test. Facial sensation and strength were normal. hearing was intact to finger rubbing bilaterally. Uvula tongue midline. head turning and shoulder shrug were normal and symmetric.Tongue protrusion into cheek strength was normal. Motor: normal bulk and tone, full strength in the BUE, BLE,  No focal weakness Coordination: finger-nose-finger, heel-to-shin bilaterally, no dysmetria Reflexes: Brachioradialis 2/2, biceps 2/2, triceps 2/2, patellar 2/2, Achilles 2/2, plantar responses were flexor bilaterally. Gait and Station: Rising up from seated position without assistance, normal stance,  moderate stride, good arm swing, smooth turning, able to perform tiptoe, and heel walking without difficulty. Tandem gait is steady  DIAGNOSTIC DATA (LABS, IMAGING, TESTING) - I reviewed patient records, labs, notes, testing and imaging myself where available.  Lab Results  Component Value Date   WBC 6.6 02/21/2013   HGB 14.2 02/21/2013   HCT 41.3 02/21/2013   MCV 86.8 02/21/2013   PLT 323 02/21/2013      Component Value Date/Time   NA 139 02/21/2013 0704   K 3.6 02/21/2013 0704   CL 105 02/21/2013 0704   CO2 25 02/21/2013 0704   GLUCOSE 92  02/21/2013 0704   BUN 10 02/21/2013 0704   CREATININE 0.71 02/21/2013 0704   CALCIUM 9.0 02/21/2013 0704   GFRNONAA >90 02/21/2013 0704   GFRAA >90 02/21/2013 0704    ASSESSMENT AND PLAN  38 y.o. year old female  has a past medical history of Partial seizure (12/07/2012); Arteriovenous malformation of brain; here to followup. Last seizure event 04/30/2013.  Continue Keppra at current dose will refill Call for any seizure activity Followup in one year and when necessary Nilda Riggs, [facility], [facility], APRN  [facility] [address] [place], [place] [address] [phone]

## 2013-12-13 NOTE — Patient Instructions (Signed)
Continue Keppra at current dose will refill Call for any seizure activity Followup in one year and when necessary

## 2013-12-13 NOTE — Progress Notes (Signed)
I have read the note, and I agree with the clinical assessment and plan.  Charles K Willis   

## 2014-02-20 ENCOUNTER — Encounter: Payer: Self-pay | Admitting: Nurse Practitioner

## 2014-02-27 ENCOUNTER — Ambulatory Visit: Payer: BC Managed Care – PPO | Admitting: Nurse Practitioner

## 2014-02-27 ENCOUNTER — Ambulatory Visit (INDEPENDENT_AMBULATORY_CARE_PROVIDER_SITE_OTHER): Payer: BC Managed Care – PPO | Admitting: Nurse Practitioner

## 2014-02-27 ENCOUNTER — Encounter: Payer: Self-pay | Admitting: Family Medicine

## 2014-02-27 VITALS — BP 93/57 | HR 58 | Temp 97.7°F | Ht 64.5 in | Wt 235.0 lb

## 2014-02-27 DIAGNOSIS — M545 Low back pain, unspecified: Secondary | ICD-10-CM

## 2014-02-27 LAB — POCT UA - MICROSCOPIC ONLY
BACTERIA, U MICROSCOPIC: NEGATIVE
CASTS, UR, LPF, POC: NEGATIVE
Crystals, Ur, HPF, POC: NEGATIVE
Mucus, UA: NEGATIVE
Yeast, UA: NEGATIVE

## 2014-02-27 LAB — POCT URINALYSIS DIPSTICK
Bilirubin, UA: NEGATIVE
Ketones, UA: NEGATIVE
Nitrite, UA: NEGATIVE
Protein, UA: NEGATIVE
SPEC GRAV UA: 1.01
UROBILINOGEN UA: NEGATIVE
pH, UA: 7.5

## 2014-02-27 MED ORDER — METHYLPREDNISOLONE ACETATE 80 MG/ML IJ SUSP
80.0000 mg | Freq: Once | INTRAMUSCULAR | Status: AC
  Administered 2014-02-27: 80 mg via INTRAMUSCULAR

## 2014-02-27 MED ORDER — CYCLOBENZAPRINE HCL 5 MG PO TABS: 5.0000 mg | ORAL_TABLET | Freq: Three times a day (TID) | ORAL | Status: DC | PRN

## 2014-02-27 NOTE — Patient Instructions (Signed)

## 2014-02-27 NOTE — Progress Notes (Signed)
   Subjective:    Patient ID: Marvetta GibbonsJennifer N Farooqui, female    DOB: 04-26-1976, 38 y.o.   MRN: [id number]  HPI  Thoracic and lumbar back pain radiating into left leg with numbness and tingling for 3 days.  Has been taking tylenol and tramadol 50mg  2 to 3 per day with some relief.  Has had muscle spasms of the back in the past.      Review of Systems  Constitutional: Negative.   Respiratory: Negative.   Cardiovascular: Negative.   Genitourinary: Negative.   Musculoskeletal:       Pain on left lower lumbar region with numbness and tingling into left leg.  Skin: Negative.   Neurological: Negative.        Objective:   Physical Exam  Constitutional: She is oriented to person, place, and time. She appears well-developed and well-nourished.  Cardiovascular: Normal rate, regular rhythm, normal heart sounds and intact distal pulses.   Pulmonary/Chest: Effort normal and breath sounds normal.  Musculoskeletal: She exhibits tenderness.  Tender points in lower thoracic and lumbar region.  Negative leg raise.  Equal muscle strength bilaterally in lower legs  Neurological: She is alert and oriented to person, place, and time. She has normal reflexes.  Skin: Skin is warm and dry.  Psychiatric: She has a normal mood and affect. Her behavior is normal. Judgment and thought content normal.      Results for orders placed in visit on 02/27/14  POCT UA - MICROSCOPIC ONLY      Result Value Ref Range   WBC, Ur, HPF, POC 5-7     RBC, urine, microscopic 5-7     Bacteria, U Microscopic neg     Mucus, UA neg     Epithelial cells, urine per micros occ     Crystals, Ur, HPF, POC neg     Casts, Ur, LPF, POC neg     Yeast, UA neg    POCT URINALYSIS DIPSTICK      Result Value Ref Range   Color, UA yellow     Clarity, UA clear     Glucose, UA eng     Bilirubin, UA neg     Ketones, UA neg     Spec Grav, UA 1.010     Blood, UA trace     pH, UA 7.5     Protein, UA neg     Urobilinogen, UA negative     Nitrite, UA neg     Leukocytes, UA Trace         Assessment & Plan:  1. Left low back pain, with sciatica presence unspecified Moist heat prn  Use Ibuprofen as needed for pain No bending or stooping RTO prn - POCT UA - Microscopic Only - POCT urinalysis dipstick - methylPREDNISolone acetate (DEPO-MEDROL) injection 80 mg; Inject 1 mL (80 mg total) into the muscle once. - cyclobenzaprine (FLEXERIL) 5 MG tablet; Take 1 tablet (5 mg total) by mouth 3 (three) times daily as needed for muscle spasms.  Dispense: 30 tablet; Refill: 1  Mary-Margaret Daphine DeutscherMartin, FNP

## 2014-12-14 ENCOUNTER — Ambulatory Visit: Payer: BC Managed Care – PPO | Admitting: Nurse Practitioner

## 2014-12-24 ENCOUNTER — Other Ambulatory Visit: Payer: Self-pay | Admitting: Nurse Practitioner

## 2015-01-04 ENCOUNTER — Ambulatory Visit (INDEPENDENT_AMBULATORY_CARE_PROVIDER_SITE_OTHER): Payer: BLUE CROSS/BLUE SHIELD | Admitting: Neurology

## 2015-01-04 ENCOUNTER — Encounter: Payer: Self-pay | Admitting: Neurology

## 2015-01-04 VITALS — BP 117/67 | HR 70 | Ht 65.0 in | Wt 235.0 lb

## 2015-01-04 DIAGNOSIS — Z8679 Personal history of other diseases of the circulatory system: Secondary | ICD-10-CM | POA: Diagnosis not present

## 2015-01-04 DIAGNOSIS — R569 Unspecified convulsions: Secondary | ICD-10-CM

## 2015-01-04 DIAGNOSIS — Z8774 Personal history of (corrected) congenital malformations of heart and circulatory system: Secondary | ICD-10-CM

## 2015-01-04 MED ORDER — LEVETIRACETAM 500 MG PO TABS: ORAL_TABLET | ORAL | Status: DC

## 2015-01-04 NOTE — Progress Notes (Signed)
Reason for visit: Seizures  Brandy Jefferson is an 39 y.o. female  History of present illness:  Brandy Jefferson is a 39 year old right-handed black female with a history of a left temporal arterial venous malformation. She has had ablation of the AVM, and she has done relatively well. The patient has seizures related to this, she has been treated with Keppra taking 500 mg in the morning and 1000 g in the evening. She has done quite well with her seizure control, the last seizure was in October 2014. The patient operates a motor vehicle without difficulty. The patient had an event in January 2016 while at a concert. The patient was overheated, and felt as if she was "phasing out". The patient was able to walk out and sit down and cool off, and felt better. The patient never lost consciousness. Her typical seizures are similar to this, however. The patient may get confused, unable to speak with her seizures. The patient does not lose consciousness. The patient believes that she may have missed a dose of Keppra the night prior to the concert. She has also noted some intermittent episodes of nosebleeds coming out of the right naris. The patient will have episodes about once a month, and she correlates this to some degree with allergies. This has been going on for about one year. She is able to stop them quickly.  Past Medical History  Diagnosis Date  . Partial seizure 12/07/2012  . Arteriovenous malformation of brain     Left temporal  . Seizures     Partial seizures, right hemisensory    Past Surgical History  Procedure Laterality Date  . Arteriovenous malformation ablation      Left temporal, intravascular procedure    Family History  Problem Relation Age of Onset  . Cancer Father     Social history:  reports that she has never smoked. She has never used smokeless tobacco. She reports that she does not drink alcohol or use illicit drugs.   No Known Allergies  Medications:  Prior to  Admission medications   Medication Sig Start Date End Date Taking? Authorizing Provider  acetaminophen (TYLENOL) 325 MG tablet Take 325-1,300 mg by mouth every 6 (six) hours as needed for pain.    Yes Historical Provider, MD  cyclobenzaprine (FLEXERIL) 5 MG tablet Take 1 tablet (5 mg total) by mouth 3 (three) times daily as needed for muscle spasms. 02/27/14  Yes Brandy Daphine Deutscher, FNP  levETIRAcetam (KEPPRA) 500 MG tablet One tablet in the morning and two at night 01/04/15  Yes York Spaniel, MD  norgestimate-ethinyl estradiol (ORTHO-CYCLEN,SPRINTEC,PREVIFEM) 0.25-35 MG-MCG tablet Take 1 tablet by mouth daily.   Yes Historical Provider, MD    ROS:  Out of a complete 14 system review of symptoms, the patient complains only of the following symptoms, and all other reviewed systems are negative.  Seizures Nosebleeds  Blood pressure 117/67, pulse 70, height  (1.651 m), weight 235 lb (106.595 kg).  Physical Exam  General: The patient is alert and cooperative at the time of the examination. The patient is markedly obese.  Skin: No significant peripheral edema is noted.   Neurologic Exam  Mental status: The patient is alert and oriented x 3 at the time of the examination. The patient has apparent normal recent and remote memory, with an apparently normal attention span and concentration ability.   Cranial nerves: Facial symmetry is present. Speech is normal, no aphasia or dysarthria is noted. Extraocular movements are  full. Visual fields are full.  Motor: The patient has good strength in all 4 extremities.  Sensory examination: Soft touch sensation is symmetric on the face, arms, and legs.  Coordination: The patient has good finger-nose-finger and heel-to-shin bilaterally.  Gait and station: The patient has a normal gait. Tandem gait is normal. Romberg is negative. No drift is seen.  Reflexes: Deep tendon reflexes are symmetric.   Assessment/Plan:  1. Left temporal  AVM  2. History of seizures  3. Recurrent epistaxis  The patient is overall doing fairly well. The patient could potentially have had a seizure in January 2016, she indicates that she may have missed a dose of Keppra the night before. We will continue the current dosing, she will follow-up in about 6 months. If the patient continues to do well, she can be followed once a year. A prescription was written for the Keppra.  Marlan Palau MD 01/04/2015 4:36 PM  [facility] [address] [place], [place] [address]  Phone [phone] Fax [phone]

## 2015-01-04 NOTE — Patient Instructions (Signed)

## 2015-07-03 ENCOUNTER — Other Ambulatory Visit: Payer: Self-pay | Admitting: Neurology

## 2015-07-16 ENCOUNTER — Telehealth: Payer: Self-pay | Admitting: *Deleted

## 2015-07-16 ENCOUNTER — Ambulatory Visit: Payer: BLUE CROSS/BLUE SHIELD | Admitting: Nurse Practitioner

## 2015-07-16 NOTE — Telephone Encounter (Signed)
I called and LMVM for pt that from Dr. Clarisa KindredWillis's note, see in 6 month's depending how she is doing then may extend to 1 yr.   May give med up until appt, although last filled 07-03-15  (90tabs).  See CM/NP.

## 2015-07-16 NOTE — Telephone Encounter (Signed)
I called back.  Got no answer.  Left message.   When patient calls back, please obtain clarification regarding her Rx question.  Thank you!

## 2015-07-16 NOTE — Telephone Encounter (Signed)
Patient called regarding Rx approval for levETIRAcetam (KEPPRA) 500 MG tablet, states Dr. Anne HahnWillis only wrote Rx for 6 months vs 1 year, also wants to push her 6 month follow up appointment out to 1 year.

## 2015-07-17 MED ORDER — LEVETIRACETAM 500 MG PO TABS: ORAL_TABLET | ORAL | Status: DC

## 2015-09-17 ENCOUNTER — Other Ambulatory Visit: Payer: Self-pay | Admitting: Neurology

## 2015-11-21 DIAGNOSIS — M722 Plantar fascial fibromatosis: Secondary | ICD-10-CM | POA: Diagnosis not present

## 2015-11-21 DIAGNOSIS — G40909 Epilepsy, unspecified, not intractable, without status epilepticus: Secondary | ICD-10-CM | POA: Diagnosis not present

## 2015-11-27 ENCOUNTER — Other Ambulatory Visit: Payer: Self-pay

## 2015-11-27 MED ORDER — LEVETIRACETAM 500 MG PO TABS: ORAL_TABLET | ORAL | Status: DC

## 2015-11-27 NOTE — Telephone Encounter (Signed)
Received faxed pharmacy request for 90 day refill of levetiracetam. Retailed - pt needs 1 yr follow-up appt in June for ongoing refills.

## 2016-02-27 DIAGNOSIS — M71572 Other bursitis, not elsewhere classified, left ankle and foot: Secondary | ICD-10-CM | POA: Diagnosis not present

## 2016-02-27 DIAGNOSIS — M722 Plantar fascial fibromatosis: Secondary | ICD-10-CM | POA: Diagnosis not present

## 2016-02-27 DIAGNOSIS — M76821 Posterior tibial tendinitis, right leg: Secondary | ICD-10-CM | POA: Diagnosis not present

## 2016-02-27 DIAGNOSIS — M76822 Posterior tibial tendinitis, left leg: Secondary | ICD-10-CM | POA: Diagnosis not present

## 2016-02-27 DIAGNOSIS — M7732 Calcaneal spur, left foot: Secondary | ICD-10-CM | POA: Diagnosis not present

## 2016-02-27 DIAGNOSIS — M71571 Other bursitis, not elsewhere classified, right ankle and foot: Secondary | ICD-10-CM | POA: Diagnosis not present

## 2016-02-27 DIAGNOSIS — M7731 Calcaneal spur, right foot: Secondary | ICD-10-CM | POA: Diagnosis not present

## 2016-02-27 DIAGNOSIS — M67371 Transient synovitis, right ankle and foot: Secondary | ICD-10-CM | POA: Diagnosis not present

## 2016-02-27 DIAGNOSIS — M67372 Transient synovitis, left ankle and foot: Secondary | ICD-10-CM | POA: Diagnosis not present

## 2016-03-05 DIAGNOSIS — M76822 Posterior tibial tendinitis, left leg: Secondary | ICD-10-CM | POA: Diagnosis not present

## 2016-03-05 DIAGNOSIS — M71571 Other bursitis, not elsewhere classified, right ankle and foot: Secondary | ICD-10-CM | POA: Diagnosis not present

## 2016-03-05 DIAGNOSIS — M71572 Other bursitis, not elsewhere classified, left ankle and foot: Secondary | ICD-10-CM | POA: Diagnosis not present

## 2016-03-05 DIAGNOSIS — M67371 Transient synovitis, right ankle and foot: Secondary | ICD-10-CM | POA: Diagnosis not present

## 2016-03-05 DIAGNOSIS — M76821 Posterior tibial tendinitis, right leg: Secondary | ICD-10-CM | POA: Diagnosis not present

## 2016-03-05 DIAGNOSIS — M722 Plantar fascial fibromatosis: Secondary | ICD-10-CM | POA: Diagnosis not present

## 2016-03-05 DIAGNOSIS — M67372 Transient synovitis, left ankle and foot: Secondary | ICD-10-CM | POA: Diagnosis not present

## 2016-03-11 DIAGNOSIS — M659 Synovitis and tenosynovitis, unspecified: Secondary | ICD-10-CM | POA: Diagnosis not present

## 2016-03-11 DIAGNOSIS — M71572 Other bursitis, not elsewhere classified, left ankle and foot: Secondary | ICD-10-CM | POA: Diagnosis not present

## 2016-03-11 DIAGNOSIS — M722 Plantar fascial fibromatosis: Secondary | ICD-10-CM | POA: Diagnosis not present

## 2016-03-11 DIAGNOSIS — M71571 Other bursitis, not elsewhere classified, right ankle and foot: Secondary | ICD-10-CM | POA: Diagnosis not present

## 2016-03-11 DIAGNOSIS — M76811 Anterior tibial syndrome, right leg: Secondary | ICD-10-CM | POA: Diagnosis not present

## 2016-05-12 ENCOUNTER — Other Ambulatory Visit: Payer: Self-pay | Admitting: Neurology

## 2016-05-18 DIAGNOSIS — M19072 Primary osteoarthritis, left ankle and foot: Secondary | ICD-10-CM | POA: Diagnosis not present

## 2016-05-18 DIAGNOSIS — M76812 Anterior tibial syndrome, left leg: Secondary | ICD-10-CM | POA: Diagnosis not present

## 2016-05-18 DIAGNOSIS — M71572 Other bursitis, not elsewhere classified, left ankle and foot: Secondary | ICD-10-CM | POA: Diagnosis not present

## 2016-05-22 DIAGNOSIS — M659 Synovitis and tenosynovitis, unspecified: Secondary | ICD-10-CM | POA: Diagnosis not present

## 2016-05-22 DIAGNOSIS — M76812 Anterior tibial syndrome, left leg: Secondary | ICD-10-CM | POA: Diagnosis not present

## 2016-05-22 DIAGNOSIS — M7752 Other enthesopathy of left foot: Secondary | ICD-10-CM | POA: Diagnosis not present

## 2016-05-28 ENCOUNTER — Encounter: Payer: Self-pay | Admitting: Nurse Practitioner

## 2016-05-28 ENCOUNTER — Ambulatory Visit (INDEPENDENT_AMBULATORY_CARE_PROVIDER_SITE_OTHER): Payer: BLUE CROSS/BLUE SHIELD | Admitting: Nurse Practitioner

## 2016-05-28 VITALS — BP 122/67 | HR 68 | Ht 65.0 in | Wt 249.6 lb

## 2016-05-28 DIAGNOSIS — R569 Unspecified convulsions: Secondary | ICD-10-CM | POA: Diagnosis not present

## 2016-05-28 DIAGNOSIS — M659 Synovitis and tenosynovitis, unspecified: Secondary | ICD-10-CM | POA: Diagnosis not present

## 2016-05-28 DIAGNOSIS — Z8774 Personal history of (corrected) congenital malformations of heart and circulatory system: Secondary | ICD-10-CM

## 2016-05-28 DIAGNOSIS — M76811 Anterior tibial syndrome, right leg: Secondary | ICD-10-CM | POA: Diagnosis not present

## 2016-05-28 MED ORDER — LEVETIRACETAM 500 MG PO TABS: ORAL_TABLET | ORAL | 3 refills | Status: DC

## 2016-05-28 NOTE — Progress Notes (Signed)
[facility]  PATIENT: Brandy Jefferson DOB: 1975/12/12   REASON FOR VISIT: Follow-up for seizure disorder and history of left temporal arteriovenous malformation HISTORY FROM: Patient    HISTORY OF PRESENT ILLNESS:Ms. Brandy Jefferson is a 40 year old right-handed female with a history of a left temporal arterial venous malformation. She has had ablation of the AVM, and she has done relatively well. The patient has seizures related to this, she has been treated with Keppra taking 500 mg in the morning and 1000 g in the evening. She has done quite well with her seizure control, the last seizure was in October 2014. The patient operates a motor vehicle without difficulty. Her typical seizure event, she may get confused, unable to speak with her seizures. The patient does not lose consciousness. The patient has had events in the past when she was a dose of Keppra . She denies further episodes of nosebleeds. She has correlated these with allergies in the past. She has had some problems with her feet in the last 6 months and has been getting injections, she returns for reevaluation and refills   REVIEW OF SYSTEMS: Full 14 system review of systems performed and notable only for those listed, all others are neg:  Constitutional: neg  Cardiovascular: neg Ear/Nose/Throat: neg  Skin: neg Eyes: neg Respiratory: neg Gastroitestinal: neg  Hematology/Lymphatic: neg  Endocrine: neg Musculoskeletal:neg Allergy/Immunology: neg Neurological: neg Psychiatric: Anxiety Sleep : neg   ALLERGIES: No Known Allergies  HOME MEDICATIONS: Outpatient Medications Prior to Visit  Medication Sig Dispense Refill  . levETIRAcetam (KEPPRA) 500 MG tablet TAKE ONE TABLET IN THE MORNING AND TWO AT NIGHT 270 tablet 0  . norgestimate-ethinyl estradiol (ORTHO-CYCLEN,SPRINTEC,PREVIFEM) 0.25-35 MG-MCG tablet Take 1 tablet by mouth daily.    Marland Kitchen. acetaminophen (TYLENOL) 325 MG tablet Take 325-1,300 mg by mouth  every 6 (six) hours as needed for pain.     . cyclobenzaprine (FLEXERIL) 5 MG tablet Take 1 tablet (5 mg total) by mouth 3 (three) times daily as needed for muscle spasms. (Patient not taking: Reported on 05/28/2016) 30 tablet 1   No facility-administered medications prior to visit.     PAST MEDICAL HISTORY: Past Medical History:  Diagnosis Date  . Arteriovenous malformation of brain    Left temporal  . Partial seizure (HCC) 12/07/2012  . Seizures (HCC)    Partial seizures, right hemisensory    PAST SURGICAL HISTORY: Past Surgical History:  Procedure Laterality Date  . Arteriovenous malformation ablation     Left temporal, intravascular procedure    FAMILY HISTORY: Family History  Problem Relation Age of Onset  . Cancer Father     SOCIAL HISTORY: Social History   Social History  . Marital status: Single    Spouse name: N/A  . Number of children: 1  . Years of education: 6012   Occupational History  .  [employer]   Social History Main Topics  . Smoking status: Never Smoker  . Smokeless tobacco: Never Used  . Alcohol use No     Comment: Occasional  . Drug use: No  . Sexual activity: Not on file   Other Topics Concern  . Not on file   Social History Narrative   Patient lives at home with daughter and her daughters dad.    Patient is not married.    Patient has 1 child.    Patient is currently working.    Patient drinks about 5 cups of caffeine daily   Patient is right handed.  PHYSICAL EXAM  Vitals:   05/28/16 0818  BP: 122/67  Pulse: 68  Weight: 249 lb 9.6 oz (113.2 kg)  Height: 5\' 5"  (1.651 m)   Body mass index is 41.54 kg/m.  Generalized: Well developed, Obese female in no acute distress  Head: normocephalic and atraumatic,. Oropharynx benign  Neck: Supple, no carotid bruits  Cardiac: Regular rate rhythm, no murmur  Musculoskeletal: No deformity   Neurological examination   Mentation: Alert oriented to time, place, history  taking. Attention span and concentration appropriate. Recent and remote memory intact.  Follows all commands speech and language fluent.   Cranial nerve II-XII: .Pupils were equal round reactive to light extraocular movements were full, visual field were full on confrontational test. Facial sensation and strength were normal. hearing was intact to finger rubbing bilaterally. Uvula tongue midline. head turning and shoulder shrug were normal and symmetric.Tongue protrusion into cheek strength was normal. Motor: normal bulk and tone, full strength in the BUE, BLE, fine finger movements normal, no pronator drift. No focal weakness Sensory: normal and symmetric to light touch, pinprick, and  Vibration, proprioception in the upper and lower extremities  Coordination: finger-nose-finger, heel-to-shin bilaterally, no dysmetria Reflexes: Brachioradialis 2/2, biceps 2/2, triceps 2/2, patellar 2/2, Achilles 2/2, plantar responses were flexor bilaterally. Gait and Station: Rising up from seated position without assistance, normal stance,  moderate stride, good arm swing, smooth turning, able to perform tiptoe, and heel walking without difficulty. Tandem gait is mildly unsteady  DIAGNOSTIC DATA (LABS, IMAGING, TESTING) -    ASSESSMENT AND PLAN  40 y.o. year old female  has a past medical history of Arteriovenous malformation of brain; Partial seizure (HCC) (12/07/2012); and Seizures (HCC). here to follow-up for seizure disorder  PLAN:Continue Keppra at current dose will refill for 1 year Try Melantonin OTC for sleep  Given patient education sheet on sleep hygiene Exercise for overall health and well being Follow up yearly and prn Nilda RiggsNancy Carolyn Cierrah Dace, [facility] LLCGNP, [facility], APRN  [facility] [address], [address] [place], [place] [address] [phone]

## 2016-05-28 NOTE — Patient Instructions (Signed)
Continue Keppra at current dose will refill for 1 year Try Melantonin OTC for sleep  Exercise for overall health and well being Follow up yearly and prn

## 2016-06-10 DIAGNOSIS — Z6841 Body Mass Index (BMI) 40.0 and over, adult: Secondary | ICD-10-CM | POA: Diagnosis not present

## 2016-06-10 DIAGNOSIS — Z01419 Encounter for gynecological examination (general) (routine) without abnormal findings: Secondary | ICD-10-CM | POA: Diagnosis not present

## 2016-06-29 DIAGNOSIS — N644 Mastodynia: Secondary | ICD-10-CM | POA: Diagnosis not present

## 2016-06-30 ENCOUNTER — Other Ambulatory Visit: Payer: Self-pay | Admitting: Obstetrics and Gynecology

## 2016-06-30 DIAGNOSIS — N644 Mastodynia: Secondary | ICD-10-CM

## 2016-07-10 DIAGNOSIS — G5752 Tarsal tunnel syndrome, left lower limb: Secondary | ICD-10-CM | POA: Diagnosis not present

## 2016-07-10 DIAGNOSIS — M76822 Posterior tibial tendinitis, left leg: Secondary | ICD-10-CM | POA: Diagnosis not present

## 2016-07-14 ENCOUNTER — Ambulatory Visit
Admission: RE | Admit: 2016-07-14 | Discharge: 2016-07-14 | Disposition: A | Discharge status: Home / Self Care | Payer: No Typology Code available for payment source | Source: Ambulatory Visit | Attending: Obstetrics and Gynecology | Admitting: Obstetrics and Gynecology

## 2016-07-14 DIAGNOSIS — N644 Mastodynia: Secondary | ICD-10-CM

## 2016-07-15 DIAGNOSIS — M71572 Other bursitis, not elsewhere classified, left ankle and foot: Secondary | ICD-10-CM | POA: Diagnosis not present

## 2016-07-15 DIAGNOSIS — G5752 Tarsal tunnel syndrome, left lower limb: Secondary | ICD-10-CM | POA: Diagnosis not present

## 2016-07-15 DIAGNOSIS — D485 Neoplasm of uncertain behavior of skin: Secondary | ICD-10-CM | POA: Diagnosis not present

## 2016-07-21 ENCOUNTER — Ambulatory Visit (INDEPENDENT_AMBULATORY_CARE_PROVIDER_SITE_OTHER): Payer: BLUE CROSS/BLUE SHIELD | Admitting: Physician Assistant

## 2016-07-21 ENCOUNTER — Encounter: Payer: Self-pay | Admitting: Physician Assistant

## 2016-07-21 VITALS — BP 117/79 | HR 71 | Temp 97.7°F | Ht 65.0 in | Wt 254.0 lb

## 2016-07-21 DIAGNOSIS — R601 Generalized edema: Secondary | ICD-10-CM

## 2016-07-21 MED ORDER — HYDROCHLOROTHIAZIDE 25 MG PO TABS: 25.0000 mg | ORAL_TABLET | Freq: Every day | ORAL | 1 refills | Status: DC

## 2016-07-21 NOTE — Patient Instructions (Signed)
DASH Eating Plan DASH stands for "Dietary Approaches to Stop Hypertension." The DASH eating plan is a healthy eating plan that has been shown to reduce high blood pressure (hypertension). Additional health benefits may include reducing the risk of type 2 diabetes mellitus, heart disease, and stroke. The DASH eating plan may also help with weight loss. What do I need to know about the DASH eating plan? For the DASH eating plan, you will follow these general guidelines:  Choose foods with less than 150 milligrams of sodium per serving (as listed on the food label).  Use salt-free seasonings or herbs instead of table salt or sea salt.  Check with your health care provider or pharmacist before using salt substitutes.  Eat lower-sodium products. These are often labeled as "low-sodium" or "no salt added."  Eat fresh foods. Avoid eating a lot of canned foods.  Eat more vegetables, fruits, and low-fat dairy products.  Choose whole grains. Look for the word "whole" as the first word in the ingredient list.  Choose fish and skinless chicken or turkey more often than red meat. Limit fish, poultry, and meat to 6 oz (170 g) each day.  Limit sweets, desserts, sugars, and sugary drinks.  Choose heart-healthy fats.  Eat more home-cooked food and less restaurant, buffet, and fast food.  Limit fried foods.  Do not fry foods. Cook foods using methods such as baking, boiling, grilling, and broiling instead.  When eating at a restaurant, ask that your food be prepared with less salt, or no salt if possible. What foods can I eat? Seek help from a dietitian for individual calorie needs. Grains  Whole grain or whole wheat bread. Brown rice. Whole grain or whole wheat pasta. Quinoa, bulgur, and whole grain cereals. Low-sodium cereals. Corn or whole wheat flour tortillas. Whole grain cornbread. Whole grain crackers. Low-sodium crackers. Vegetables  Fresh or frozen vegetables (raw, steamed, roasted, or  grilled). Low-sodium or reduced-sodium tomato and vegetable juices. Low-sodium or reduced-sodium tomato sauce and paste. Low-sodium or reduced-sodium canned vegetables. Fruits  All fresh, canned (in natural juice), or frozen fruits. Meat and Other Protein Products  Ground beef (85% or leaner), grass-fed beef, or beef trimmed of fat. Skinless chicken or turkey. Ground chicken or turkey. Pork trimmed of fat. All fish and seafood. Eggs. Dried beans, peas, or lentils. Unsalted nuts and seeds. Unsalted canned beans. Dairy  Low-fat dairy products, such as skim or 1% milk, 2% or reduced-fat cheeses, low-fat ricotta or cottage cheese, or plain low-fat yogurt. Low-sodium or reduced-sodium cheeses. Fats and Oils  Tub margarines without trans fats. Light or reduced-fat mayonnaise and salad dressings (reduced sodium). Avocado. Safflower, olive, or canola oils. Natural peanut or almond butter. Other  Unsalted popcorn and pretzels. The items listed above may not be a complete list of recommended foods or beverages. Contact your dietitian for more options.  What foods are not recommended? Grains  White bread. White pasta. White rice. Refined cornbread. Bagels and croissants. Crackers that contain trans fat. Vegetables  Creamed or fried vegetables. Vegetables in a cheese sauce. Regular canned vegetables. Regular canned tomato sauce and paste. Regular tomato and vegetable juices. Fruits  Canned fruit in light or heavy syrup. Fruit juice. Meat and Other Protein Products  Fatty cuts of meat. Ribs, chicken wings, bacon, sausage, bologna, salami, chitterlings, fatback, hot dogs, bratwurst, and packaged luncheon meats. Salted nuts and seeds. Canned beans with salt. Dairy  Whole or 2% milk, cream, half-and-half, and cream cheese. Whole-fat or sweetened yogurt. Full-fat cheeses   or blue cheese. Nondairy creamers and whipped toppings. Processed cheese, cheese spreads, or cheese curds. Condiments  Onion and garlic  salt, seasoned salt, table salt, and sea salt. Canned and packaged gravies. Worcestershire sauce. Tartar sauce. Barbecue sauce. Teriyaki sauce. Soy sauce, including reduced sodium. Steak sauce. Fish sauce. Oyster sauce. Cocktail sauce. Horseradish. Ketchup and mustard. Meat flavorings and tenderizers. Bouillon cubes. Hot sauce. Tabasco sauce. Marinades. Taco seasonings. Relishes. Fats and Oils  Butter, stick margarine, lard, shortening, ghee, and bacon fat. Coconut, palm kernel, or palm oils. Regular salad dressings. Other  Pickles and olives. Salted popcorn and pretzels. The items listed above may not be a complete list of foods and beverages to avoid. Contact your dietitian for more information.  Where can I find more information? National Heart, Lung, and Blood Institute: www.nhlbi.nih.gov/health/health-topics/topics/dash/ This information is not intended to replace advice given to you by your health care provider. Make sure you discuss any questions you have with your health care provider. Document Released: 06/18/2011 Document Revised: 12/05/2015 Document Reviewed: 05/03/2013 Elsevier Interactive Patient Education  2017 Elsevier Inc.  

## 2016-07-22 NOTE — Progress Notes (Signed)
BP 117/79   Pulse 71   Temp 97.7 F (36.5 C) (Oral)   Ht 5\' 5"  (1.651 m)   Wt 254 lb (115.2 kg)   BMI 42.27 kg/m    Subjective:    Patient ID: Brandy Jefferson, female    DOB: 1975/10/12, 41 y.o.   MRN: [id number]  HPI: Brandy Jefferson is a 41 y.o. female presenting on 07/21/2016 for Foot Swelling (and legs)  Patient here to be established as new patient at [facility].  This patient is known to me from [facility]. She has been taking Vimovo through her podiatrist and thinks the edema has gotten worse while on it.  She will be seeing him next week. However the foot pain is improved, and she is trying not to have to have surgery.  Relevant past medical, surgical, family and social history reviewed and updated as indicated. Allergies and medications reviewed and updated.  Past Medical History:  Diagnosis Date  . Arteriovenous malformation of brain    Left temporal  . Partial seizure (HCC) 12/07/2012  . Seizures (HCC)    Partial seizures, right hemisensory    Past Surgical History:  Procedure Laterality Date  . Arteriovenous malformation ablation     Left temporal, intravascular procedure    Review of Systems  Constitutional: Negative.  Negative for activity change, fatigue and fever.  HENT: Negative.   Eyes: Negative.   Respiratory: Negative.  Negative for cough and wheezing.   Cardiovascular: Positive for leg swelling. Negative for chest pain and palpitations.  Gastrointestinal: Negative.  Negative for abdominal pain.  Endocrine: Negative.   Genitourinary: Negative.  Negative for dysuria.  Musculoskeletal: Positive for arthralgias and joint swelling.  Skin: Negative.   Neurological: Negative.     Allergies as of 07/21/2016   No Known Allergies     Medication List       Accurate as of 07/21/16 11:59 PM. Always use your most recent med list.          hydrochlorothiazide 25 MG tablet Commonly known as:  HYDRODIURIL Take 1  tablet (25 mg total) by mouth daily.   ibuprofen 800 MG tablet Commonly known as:  ADVIL,MOTRIN Take 800 mg by mouth every 8 (eight) hours as needed.   levETIRAcetam 500 MG tablet Commonly known as:  KEPPRA TAKE ONE TABLET IN THE MORNING AND TWO AT NIGHT   norgestimate-ethinyl estradiol 0.25-35 MG-MCG tablet Commonly known as:  ORTHO-CYCLEN,SPRINTEC,PREVIFEM Take 1 tablet by mouth daily.   VIMOVO 500-20 MG Tbec Generic drug:  Naproxen-Esomeprazole Take 1 tablet by mouth 2 (two) times daily.          Objective:    BP 117/79   Pulse 71   Temp 97.7 F (36.5 C) (Oral)   Ht 5\' 5"  (1.651 m)   Wt 254 lb (115.2 kg)   BMI 42.27 kg/m   No Known Allergies  Physical Exam  Constitutional: She is oriented to person, place, and time. She appears well-developed and well-nourished.  HENT:  Head: Normocephalic and atraumatic.  Eyes: Conjunctivae and EOM are normal. Pupils are equal, round, and reactive to light.  Cardiovascular: Normal rate, regular rhythm, normal heart sounds and intact distal pulses.   2 + pretibial edema bilateral   Pulmonary/Chest: Effort normal and breath sounds normal.  Abdominal: Soft. Bowel sounds are normal.  Musculoskeletal: She exhibits tenderness and deformity.  Neurological: She is alert and oriented to person, place, and time. She has normal reflexes.  Skin:  Skin is warm and dry. No rash noted.  Psychiatric: She has a normal mood and affect. Her behavior is normal. Judgment and thought content normal.        Assessment & Plan:   1. Generalized edema - hydrochlorothiazide (HYDRODIURIL) 25 MG tablet; Take 1 tablet (25 mg total) by mouth daily.  Dispense: 90 tablet; Refill: 1   Continue all other maintenance medications as listed above.  Follow up plan: Return in about 4 weeks (around 08/18/2016) for recheck.  No orders of the defined types were placed in this encounter.   Educational handout given for DASH diet, low sodium  Remus LofflerAngel S. Labrenda Lasky  PA-C [facility] [address]  [place], [place] [address] [phone]   07/22/2016, 11:22 AM

## 2016-07-27 DIAGNOSIS — G5752 Tarsal tunnel syndrome, left lower limb: Secondary | ICD-10-CM | POA: Diagnosis not present

## 2016-07-27 DIAGNOSIS — M7752 Other enthesopathy of left foot: Secondary | ICD-10-CM | POA: Diagnosis not present

## 2016-07-27 DIAGNOSIS — M659 Synovitis and tenosynovitis, unspecified: Secondary | ICD-10-CM | POA: Diagnosis not present

## 2016-08-13 DIAGNOSIS — M76821 Posterior tibial tendinitis, right leg: Secondary | ICD-10-CM | POA: Diagnosis not present

## 2016-08-13 DIAGNOSIS — M2141 Flat foot [pes planus] (acquired), right foot: Secondary | ICD-10-CM | POA: Diagnosis not present

## 2016-08-13 DIAGNOSIS — M79672 Pain in left foot: Secondary | ICD-10-CM | POA: Diagnosis not present

## 2016-08-13 DIAGNOSIS — M79671 Pain in right foot: Secondary | ICD-10-CM | POA: Diagnosis not present

## 2016-08-21 ENCOUNTER — Encounter: Payer: Self-pay | Admitting: Physician Assistant

## 2016-08-21 ENCOUNTER — Ambulatory Visit (INDEPENDENT_AMBULATORY_CARE_PROVIDER_SITE_OTHER): Payer: BLUE CROSS/BLUE SHIELD | Admitting: Physician Assistant

## 2016-08-21 VITALS — BP 115/58 | HR 62 | Temp 97.2°F | Ht 65.0 in | Wt 249.0 lb

## 2016-08-21 DIAGNOSIS — M722 Plantar fascial fibromatosis: Secondary | ICD-10-CM

## 2016-08-21 NOTE — Patient Instructions (Addendum)
Breakfast: eggs 2-3 Or greek yogurt low fat DANNON 1 slice delightful Terrill MohrSara Lee bread  Lunch: 2 slice Terrill MohrSara Lee delightfully bread       Or Nature's Own Light 4 ounces chicken, Malawiturkey, roast beef 1 slice Thin sliced cheese Sargento Mustard ok 1 piece of fruit  Supper: 6 ounces lean meat 2 cups raw/cooked veg or 1 cup pintos, corn lima  3 snacks 100 calories or less'   Frozen meals 350-400 cal, 15-20 g protein

## 2016-08-24 NOTE — Progress Notes (Signed)
BP (!) 115/58   Pulse 62   Temp 97.2 F (36.2 C) (Oral)   Ht 5\' 5"  (1.651 m)   Wt 249 lb (112.9 kg)   BMI 41.44 kg/m    Subjective:    Patient ID: Brandy Jefferson, female    DOB: [birth year], 41 y.o.   MRN: [id number]  HPI: Brandy Jefferson is a 41 y.o. female presenting on 08/21/2016 for 1  month follow up (plantar fasciitis) This patient comes in for periodic recheck on medications and plantar fasciitis. All medications are reviewed today. There are no reports of any problems with the medications. All of the medical conditions are reviewed and updated.  Lab work is reviewed and will be ordered as medically necessary. Stable with feet and pain. She is seeing Dr Adam Phenix, podiatrist.  Relevant past medical, surgical, family and social history reviewed and updated as indicated. Allergies and medications reviewed and updated.  Past Medical History:  Diagnosis Date  . Arteriovenous malformation of brain    Left temporal  . Partial seizure (HCC) 12/07/2012  . Seizures (HCC)    Partial seizures, right hemisensory    Past Surgical History:  Procedure Laterality Date  . Arteriovenous malformation ablation     Left temporal, intravascular procedure    Review of Systems  Constitutional: Negative.   HENT: Negative.   Eyes: Negative.   Respiratory: Negative.   Gastrointestinal: Negative.   Genitourinary: Negative.   Musculoskeletal: Positive for arthralgias, gait problem and joint swelling.    Allergies as of 08/21/2016   No Known Allergies     Medication List       Accurate as of 08/21/16 11:59 PM. Always use your most recent med list.          diclofenac 75 MG EC tablet Commonly known as:  VOLTAREN STARTING ON 08/24/16, TAKE 1 TABLET TWICE A DAY AFTER MEALS   hydrochlorothiazide 25 MG tablet Commonly known as:  HYDRODIURIL Take 1 tablet (25 mg total) by mouth daily.   levETIRAcetam 500 MG tablet Commonly known as:  KEPPRA TAKE ONE TABLET IN THE MORNING AND TWO  AT NIGHT   norgestimate-ethinyl estradiol 0.25-35 MG-MCG tablet Commonly known as:  ORTHO-CYCLEN,SPRINTEC,PREVIFEM Take 1 tablet by mouth daily.   predniSONE 5 MG (48) Tbpk tablet Commonly known as:  STERAPRED UNI-PAK 48 TAB START ON 08/14/16, TAKE AS DIRECTED ON PACKAGE          Objective:    BP (!) 115/58   Pulse 62   Temp 97.2 F (36.2 C) (Oral)   Ht 5\' 5"  (1.651 m)   Wt 249 lb (112.9 kg)   BMI 41.44 kg/m   No Known Allergies  Physical Exam  Constitutional: She is oriented to person, place, and time. She appears well-developed and well-nourished.  HENT:  Head: Normocephalic and atraumatic.  Eyes: Conjunctivae and EOM are normal. Pupils are equal, round, and reactive to light.  Cardiovascular: Normal rate, regular rhythm, normal heart sounds and intact distal pulses.   Pulmonary/Chest: Effort normal and breath sounds normal.  Abdominal: Soft. Bowel sounds are normal.  Musculoskeletal: She exhibits tenderness.       Right foot: There is tenderness. There is no swelling and no deformity.       Left foot: There is tenderness. There is normal capillary refill and no deformity.       Feet:  Neurological: She is alert and oriented to person, place, and time. She has normal reflexes.  Skin: Skin is warm  and dry. No rash noted.  Psychiatric: She has a normal mood and affect. Her behavior is normal. Judgment and thought content normal.        Assessment & Plan:   1. Plantar fasciitis, bilateral - predniSONE (STERAPRED UNI-PAK 48 TAB) 5 MG (48) TBPK tablet; START ON 08/14/16, TAKE AS DIRECTED ON PACKAGE; Refill: 0 - diclofenac (VOLTAREN) 75 MG EC tablet; STARTING ON 08/24/16, TAKE 1 TABLET TWICE A DAY AFTER MEALS; Refill: 0   Continue all other maintenance medications as listed above.  Follow up plan: Return in about 6 months (around 02/18/2017) for recheck.  Educational handout given for plantar fasciitis  Remus LofflerAngel S. Bristyl Mclees PA-C [facility] [address]  [place], [place] [id number] [phone]   08/24/2016, 8:09 AM

## 2016-09-03 DIAGNOSIS — M79672 Pain in left foot: Secondary | ICD-10-CM | POA: Diagnosis not present

## 2016-09-03 DIAGNOSIS — M79671 Pain in right foot: Secondary | ICD-10-CM | POA: Diagnosis not present

## 2016-10-29 DIAGNOSIS — M76821 Posterior tibial tendinitis, right leg: Secondary | ICD-10-CM | POA: Diagnosis not present

## 2016-10-29 DIAGNOSIS — M79671 Pain in right foot: Secondary | ICD-10-CM | POA: Diagnosis not present

## 2016-10-29 DIAGNOSIS — M76822 Posterior tibial tendinitis, left leg: Secondary | ICD-10-CM | POA: Diagnosis not present

## 2016-10-29 DIAGNOSIS — M79672 Pain in left foot: Secondary | ICD-10-CM | POA: Diagnosis not present

## 2016-11-04 ENCOUNTER — Ambulatory Visit (INDEPENDENT_AMBULATORY_CARE_PROVIDER_SITE_OTHER): Payer: BLUE CROSS/BLUE SHIELD | Admitting: Family Medicine

## 2016-11-04 ENCOUNTER — Encounter: Payer: Self-pay | Admitting: Family Medicine

## 2016-11-04 VITALS — BP 131/94 | HR 71 | Temp 97.1°F | Ht 65.0 in | Wt 245.6 lb

## 2016-11-04 DIAGNOSIS — N898 Other specified noninflammatory disorders of vagina: Secondary | ICD-10-CM | POA: Diagnosis not present

## 2016-11-04 DIAGNOSIS — N3001 Acute cystitis with hematuria: Secondary | ICD-10-CM

## 2016-11-04 LAB — URINALYSIS
Bilirubin, UA: NEGATIVE
Glucose, UA: NEGATIVE
Ketones, UA: NEGATIVE
Nitrite, UA: NEGATIVE
PH UA: 5.5 (ref 5.0–7.5)
PROTEIN UA: NEGATIVE
SPEC GRAV UA: 1.015 (ref 1.005–1.030)
Urobilinogen, Ur: 0.2 mg/dL (ref 0.2–1.0)

## 2016-11-04 MED ORDER — CIPROFLOXACIN HCL 250 MG PO TABS: 250.0000 mg | ORAL_TABLET | Freq: Two times a day (BID) | ORAL | 0 refills | Status: DC

## 2016-11-04 NOTE — Patient Instructions (Signed)
Great to meet you!  Be sure to finish all antibiotics   Urinary Tract Infection, Adult A urinary tract infection (UTI) is an infection of any part of the urinary tract, which includes the kidneys, ureters, bladder, and urethra. These organs make, store, and get rid of urine in the body. UTI can be a bladder infection (cystitis) or kidney infection (pyelonephritis). What are the causes? This infection may be caused by fungi, viruses, or bacteria. Bacteria are the most common cause of UTIs. This condition can also be caused by repeated incomplete emptying of the bladder during urination. What increases the risk? This condition is more likely to develop if:  You ignore your need to urinate or hold urine for long periods of time.  You do not empty your bladder completely during urination.  You wipe back to front after urinating or having a bowel movement, if you are female.  You are uncircumcised, if you are female.  You are constipated.  You have a urinary catheter that stays in place (indwelling).  You have a weak defense (immune) system.  You have a medical condition that affects your bowels, kidneys, or bladder.  You have diabetes.  You take antibiotic medicines frequently or for long periods of time, and the antibiotics no longer work well against certain types of infections (antibiotic resistance).  You take medicines that irritate your urinary tract.  You are exposed to chemicals that irritate your urinary tract.  You are female.  What are the signs or symptoms? Symptoms of this condition include:  Fever.  Frequent urination or passing small amounts of urine frequently.  Needing to urinate urgently.  Pain or burning with urination.  Urine that smells bad or unusual.  Cloudy urine.  Pain in the lower abdomen or back.  Trouble urinating.  Blood in the urine.  Vomiting or being less hungry than normal.  Diarrhea or abdominal pain.  Vaginal discharge, if  you are female.  How is this diagnosed? This condition is diagnosed with a medical history and physical exam. You will also need to provide a urine sample to test your urine. Other tests may be done, including:  Blood tests.  Sexually transmitted disease (STD) testing.  If you have had more than one UTI, a cystoscopy or imaging studies may be done to determine the cause of the infections. How is this treated? Treatment for this condition often includes a combination of two or more of the following:  Antibiotic medicine.  Other medicines to treat less common causes of UTI.  Over-the-counter medicines to treat pain.  Drinking enough water to stay hydrated.  Follow these instructions at home:  Take over-the-counter and prescription medicines only as told by your health care provider.  If you were prescribed an antibiotic, take it as told by your health care provider. Do not stop taking the antibiotic even if you start to feel better.  Avoid alcohol, caffeine, tea, and carbonated beverages. They can irritate your bladder.  Drink enough fluid to keep your urine clear or pale yellow.  Keep all follow-up visits as told by your health care provider. This is important.  Make sure to: ? Empty your bladder often and completely. Do not hold urine for long periods of time. ? Empty your bladder before and after sex. ? Wipe from front to back after a bowel movement if you are female. Use each tissue one time when you wipe. Contact a health care provider if:  You have back pain.  You have   a fever.  You feel nauseous or vomit.  Your symptoms do not get better after 3 days.  Your symptoms go away and then return. Get help right away if:  You have severe back pain or lower abdominal pain.  You are vomiting and cannot keep down any medicines or water. This information is not intended to replace advice given to you by your health care provider. Make sure you discuss any questions you  have with your health care provider. Document Released: 04/08/2005 Document Revised: 12/11/2015 Document Reviewed: 05/20/2015 Elsevier Interactive Patient Education  2017 Elsevier Inc.  

## 2016-11-04 NOTE — Progress Notes (Signed)
   HPI  Patient presents today here with concern for UTI and vaginal discharge.  Patient states that for the last 2-3 days she's had dark-colored urine. She also complains of new onset low back pain this morning. No fevers, chills, sweats, dysuria.  Patient is tolerating food and fluids like usual.  Vaginal discharge Patient reports 3 weeks or so of increased vaginal discharge that is dark brown in color. She denies any pelvic pain. She states over the last 1 week or so her underwear have been wet frequently in she's had foul-smelling discharge.  PMH: Smoking status noted ROS: Per HPI  Objective: BP (!) 131/94   Pulse 71   Temp 97.1 F (36.2 C) (Oral)   Ht  (1.651 m)   Wt 245 lb 9.6 oz (111.4 kg)   BMI 40.87 kg/m  Gen: NAD, alert, cooperative with exam HEENT: NCAT CV: RRR, good S1/S2, no murmur Resp: CTABL, no wheezes, non-labored Abd: SNTND, BS present, no guarding or organomegaly, no suprapubic tenderness, no CVA tenderness Ext: No edema, warm Neuro: Alert and oriented, No gross deficits  Assessment and plan:  # UTI Urinalysis consistent with mild UTI Treat with Cipro 3 days Exam is reassuring, no signs of sepsis  # Vaginal discharge Most consistent clinically with bacterial vaginosis Wet prep collected with blind swab and sent to be evaluated at the lab, our point-of-care testing is unavailable in the evening clinic. Explained to patient that if she has persistent symptoms and bacterial vaginosis is not seen that she should come back for pelvic exam and more thorough evaluation. ( she would like to avoid pelvic tonight)   Orders Placed This Encounter  Procedures  . WET PREP FOR TRICH, YEAST, CLUE  . Urinalysis    Meds ordered this encounter  Medications  . ciprofloxacin (CIPRO) 250 MG tablet    Sig: Take 1 tablet (250 mg total) by mouth 2 (two) times daily.    Dispense:  6 tablet    Refill:  0    Murtis Sink, MD [facility] [place] 11/04/2016, 5:47 PM

## 2016-11-05 LAB — WET PREP FOR TRICH, YEAST, CLUE
Clue Cell Exam: POSITIVE — AB
Trichomonas Exam: NEGATIVE
Yeast Exam: NEGATIVE

## 2016-11-05 MED ORDER — METRONIDAZOLE 500 MG PO TABS: 500.0000 mg | ORAL_TABLET | Freq: Two times a day (BID) | ORAL | 0 refills | Status: DC

## 2016-11-05 NOTE — Addendum Note (Signed)
Addended by: Lorelee Cover C on: 11/05/2016 10:33 AM   Modules accepted: Orders

## 2017-01-10 ENCOUNTER — Other Ambulatory Visit: Payer: Self-pay | Admitting: Physician Assistant

## 2017-01-10 DIAGNOSIS — R601 Generalized edema: Secondary | ICD-10-CM

## 2017-04-16 ENCOUNTER — Other Ambulatory Visit: Payer: Self-pay | Admitting: Family Medicine

## 2017-04-16 DIAGNOSIS — R601 Generalized edema: Secondary | ICD-10-CM

## 2017-04-27 ENCOUNTER — Ambulatory Visit (INDEPENDENT_AMBULATORY_CARE_PROVIDER_SITE_OTHER): Payer: BLUE CROSS/BLUE SHIELD | Admitting: Physician Assistant

## 2017-04-27 ENCOUNTER — Encounter: Payer: Self-pay | Admitting: Physician Assistant

## 2017-04-27 VITALS — BP 106/63 | HR 63 | Ht 65.0 in | Wt 243.6 lb

## 2017-04-27 DIAGNOSIS — M25511 Pain in right shoulder: Secondary | ICD-10-CM

## 2017-04-27 DIAGNOSIS — G8929 Other chronic pain: Secondary | ICD-10-CM | POA: Diagnosis not present

## 2017-04-27 MED ORDER — CYCLOBENZAPRINE HCL 10 MG PO TABS: 10.0000 mg | ORAL_TABLET | Freq: Three times a day (TID) | ORAL | 0 refills | Status: DC | PRN

## 2017-04-27 MED ORDER — METHYLPREDNISOLONE ACETATE 80 MG/ML IJ SUSP
80.0000 mg | Freq: Once | INTRAMUSCULAR | Status: AC
  Administered 2017-04-27: 80 mg via INTRAMUSCULAR

## 2017-04-27 NOTE — Progress Notes (Signed)
BP 106/63   Pulse 63   Ht  (1.651 m)   Wt 243 lb 9.6 oz (110.5 kg)   BMI 40.54 kg/m    Subjective:    Patient ID: Brandy Jefferson, female    DOB: [birth year], 41 y.o.   MRN: [id number]  HPI: Brandy Jefferson is a 41 y.o. female presenting on 04/27/2017 for Shoulder Pain (bilateral) and Back Pain (lower )  Patient has had chronic low back pain over the years. She works a very physical job. She does use diclofenac on a directly basis she also has had some foot problems in the past. This time her right shoulder is the most bothersome. It is a very painful in the anterior and lateral portions. She has decreased range of motion but she flexes the arm forward or to the side. She has a hard time bringing up behind her neck. She does perform her job with a lot of overhead working. She does not know of any injury she ever had to her shoulder.  Relevant past medical, surgical, family and social history reviewed and updated as indicated. Allergies and medications reviewed and updated.  Past Medical History:  Diagnosis Date  . Arteriovenous malformation of brain    Left temporal  . Partial seizure (HCC) 12/07/2012  . Seizures (HCC)    Partial seizures, right hemisensory    Past Surgical History:  Procedure Laterality Date  . Arteriovenous malformation ablation     Left temporal, intravascular procedure    Review of Systems  Constitutional: Negative.   HENT: Negative.   Eyes: Negative.   Respiratory: Negative.   Gastrointestinal: Negative.   Genitourinary: Negative.   Musculoskeletal: Positive for arthralgias, myalgias and neck pain. Negative for joint swelling.    Allergies as of 04/27/2017   No Known Allergies     Medication List       Accurate as of 04/27/17 11:59 PM. Always use your most recent med list.          cyclobenzaprine 10 MG tablet Commonly known as:  FLEXERIL Take 1 tablet (10 mg total) by mouth 3 (three) times daily as needed for muscle spasms.     diclofenac 75 MG EC tablet Commonly known as:  VOLTAREN STARTING ON 08/24/16, TAKE 1 TABLET TWICE A DAY AFTER MEALS   hydrochlorothiazide 25 MG tablet Commonly known as:  HYDRODIURIL TAKE 1 TABLET BY MOUTH EVERY DAY   levETIRAcetam 500 MG tablet Commonly known as:  KEPPRA TAKE ONE TABLET IN THE MORNING AND TWO AT NIGHT   norgestimate-ethinyl estradiol 0.25-35 MG-MCG tablet Commonly known as:  ORTHO-CYCLEN,SPRINTEC,PREVIFEM Take 1 tablet by mouth daily.          Objective:    BP 106/63   Pulse 63   Ht  (1.651 m)   Wt 243 lb 9.6 oz (110.5 kg)   BMI 40.54 kg/m   No Known Allergies  Physical Exam  Constitutional: She is oriented to person, place, and time. She appears well-developed and well-nourished.  HENT:  Head: Normocephalic and atraumatic.  Eyes: Pupils are equal, round, and reactive to light. Conjunctivae and EOM are normal.  Cardiovascular: Normal rate, regular rhythm, normal heart sounds and intact distal pulses.   Pulmonary/Chest: Effort normal and breath sounds normal.  Abdominal: Soft. Bowel sounds are normal.  Musculoskeletal:       Right shoulder: She exhibits decreased range of motion and pain. She exhibits no effusion, no crepitus and no deformity.  Lumbar back: She exhibits decreased range of motion and pain.       Arms: Neurological: She is alert and oriented to person, place, and time. She has normal reflexes.  Skin: Skin is warm and dry. No rash noted.  Psychiatric: She has a normal mood and affect. Her behavior is normal. Judgment and thought content normal.        Assessment & Plan:   1. Chronic right shoulder pain - methylPREDNISolone acetate (DEPO-MEDROL) injection 80 mg; Inject 1 mL (80 mg total) into the muscle once. - cyclobenzaprine (FLEXERIL) 10 MG tablet; Take 1 tablet (10 mg total) by mouth 3 (three) times daily as needed for muscle spasms.  Dispense: 60 tablet; Refill: 0 - Ambulatory referral to Orthopedic  Surgery    Current Outpatient Prescriptions:  .  diclofenac (VOLTAREN) 75 MG EC tablet, STARTING ON 08/24/16, TAKE 1 TABLET TWICE A DAY AFTER MEALS, Disp: , Rfl: 0 .  hydrochlorothiazide (HYDRODIURIL) 25 MG tablet, TAKE 1 TABLET BY MOUTH EVERY DAY, Disp: 90 tablet, Rfl: 0 .  levETIRAcetam (KEPPRA) 500 MG tablet, TAKE ONE TABLET IN THE MORNING AND TWO AT NIGHT, Disp: 270 tablet, Rfl: 3 .  norgestimate-ethinyl estradiol (ORTHO-CYCLEN,SPRINTEC,PREVIFEM) 0.25-35 MG-MCG tablet, Take 1 tablet by mouth daily., Disp: , Rfl:  .  cyclobenzaprine (FLEXERIL) 10 MG tablet, Take 1 tablet (10 mg total) by mouth 3 (three) times daily as needed for muscle spasms., Disp: 60 tablet, Rfl: 0 Continue all other maintenance medications as listed above.  Follow up plan: Return if symptoms worsen or fail to improve.  Educational handout given for survey  Remus Loffler PA-C [facility] [address]  [place], [place] [address] [phone]   04/28/2017, 9:51 AM

## 2017-04-27 NOTE — Patient Instructions (Signed)
In a few days you may receive a survey in the mail or online from [employer] regarding your visit with us today. Please take a moment to fill this out. Your feedback is very important to our whole office. It can help us better understand your needs as well as improve your experience and satisfaction. Thank you for taking your time to complete it. We care about you.  Zayden Maffei, PA-C  

## 2017-05-07 DIAGNOSIS — M25511 Pain in right shoulder: Secondary | ICD-10-CM | POA: Diagnosis not present

## 2017-05-24 ENCOUNTER — Other Ambulatory Visit: Payer: Self-pay | Admitting: Nurse Practitioner

## 2017-06-16 DIAGNOSIS — Z6841 Body Mass Index (BMI) 40.0 and over, adult: Secondary | ICD-10-CM | POA: Diagnosis not present

## 2017-06-16 DIAGNOSIS — Z01419 Encounter for gynecological examination (general) (routine) without abnormal findings: Secondary | ICD-10-CM | POA: Diagnosis not present

## 2017-07-13 ENCOUNTER — Other Ambulatory Visit: Payer: Self-pay | Admitting: Family Medicine

## 2017-07-13 DIAGNOSIS — R601 Generalized edema: Secondary | ICD-10-CM

## 2017-07-28 ENCOUNTER — Encounter: Payer: Self-pay | Admitting: Family

## 2017-07-28 ENCOUNTER — Ambulatory Visit (INDEPENDENT_AMBULATORY_CARE_PROVIDER_SITE_OTHER): Payer: BLUE CROSS/BLUE SHIELD

## 2017-07-28 ENCOUNTER — Ambulatory Visit: Payer: BLUE CROSS/BLUE SHIELD | Admitting: Family

## 2017-07-28 VITALS — BP 124/79 | HR 79 | Temp 96.9°F | Ht 65.0 in | Wt 256.8 lb

## 2017-07-28 DIAGNOSIS — M25562 Pain in left knee: Secondary | ICD-10-CM

## 2017-07-28 DIAGNOSIS — M1712 Unilateral primary osteoarthritis, left knee: Secondary | ICD-10-CM

## 2017-07-28 DIAGNOSIS — E66812 Obesity, class 2: Secondary | ICD-10-CM | POA: Insufficient documentation

## 2017-07-28 MED ORDER — METHYLPREDNISOLONE ACETATE 40 MG/ML IJ SUSP
40.0000 mg | Freq: Once | INTRAMUSCULAR | Status: AC
  Administered 2017-07-28: 40 mg via INTRA_ARTICULAR

## 2017-07-28 MED ORDER — BUPIVACAINE HCL 0.25 % IJ SOLN
1.0000 mL | Freq: Once | INTRAMUSCULAR | Status: AC
  Administered 2017-07-28: 1 mL via INTRA_ARTICULAR

## 2017-07-28 NOTE — Progress Notes (Signed)
   Subjective:    Patient ID: Brandy GibbonsJennifer N Jefferson, female    DOB: [birth year], 42 y.o.   MRN: [id number]  Knee Pain   The incident occurred more than 1 week ago. There was no injury mechanism. The pain is present in the left knee. The quality of the pain is described as aching. The pain is at a severity of 6/10. The pain is moderate. The pain has been intermittent since onset. Pertinent negatives include no numbness or tingling. She reports no foreign bodies present. The symptoms are aggravated by weight bearing and movement. She has tried acetaminophen, rest and NSAIDs for the symptoms. The treatment provided mild relief.      Review of Systems  Neurological: Negative for tingling and numbness.  All other systems reviewed and are negative.      Objective:   Physical Exam  Constitutional: She is oriented to person, place, and time. She appears well-developed and well-nourished. No distress.  HENT:  Head: Normocephalic.  Eyes: Pupils are equal, round, and reactive to light.  Neck: Normal range of motion. Neck supple. No thyromegaly present.  Cardiovascular: Normal rate, regular rhythm, normal heart sounds and intact distal pulses.  No murmur heard. Pulmonary/Chest: Effort normal and breath sounds normal. No respiratory distress. She has no wheezes.  Abdominal: Soft. Bowel sounds are normal. She exhibits no distension. There is no tenderness.  Musculoskeletal: Normal range of motion. She exhibits tenderness (left knee with flexion and extension).  Neurological: She is alert and oriented to person, place, and time.  Skin: Skin is warm and dry.  Psychiatric: She has a normal mood and affect. Her behavior is normal. Judgment and thought content normal.  Vitals reviewed.   leftknee prepped with betadine Injected with Marcaine .5% plain and methylprednisolone with 22 guage needle x 1. Patient tolerated well.   BP 124/79   Pulse 79   Temp (!) 96.9 F (36.1 C) (Oral)   Ht 5\' 5"  (1.651 m)    Wt 256 lb 12.8 oz (116.5 kg)   BMI 42.73 kg/m      Assessment & Plan:  1. Acute pain of left knee - DG Knee 1-2 Views Left; Future - bupivacaine (MARCAINE) 0.25 % (with pres) injection 1 mL - methylPREDNISolone acetate (DEPO-MEDROL) injection 40 mg  2. Morbid obesity (HCC) - DG Knee 1-2 Views Left; Future  3. Primary osteoarthritis of left knee - DG Knee 1-2 Views Left; Future - bupivacaine (MARCAINE) 0.25 % (with pres) injection 1 mL - methylPREDNISolone acetate (DEPO-MEDROL) injection 40 mg  Rest Ice  ROM exercises Continue Voltaren BID with food RTO prn and keep follow up app with PCP  Jannifer Rodneyhristy Govani Radloff, FNP

## 2017-07-28 NOTE — Patient Instructions (Signed)
Knee Injection, Care After  Refer to this sheet in the next few weeks. These instructions provide you with information about caring for yourself after your procedure. Your health care provider may also give you more specific instructions. Your treatment has been planned according to current medical practices, but problems sometimes occur. Call your health care provider if you have any problems or questions after your procedure.  What can I expect after the procedure?  After the procedure, it is common to have:   Soreness.   Warmth.   Swelling.    You may have more pain, swelling, and warmth than you did before the injection. This reaction may last for about one day.  Follow these instructions at home:  Bathing   If you were given a bandage (dressing), keep it dry until your health care provider says it can be removed. Ask your health care provider when you can start showering or taking a bath.  Managing pain, stiffness, and swelling   If directed, apply ice to the injection area:  ? Put ice in a plastic bag.  ? Place a towel between your skin and the bag.  ? Leave the ice on for 20 minutes, 2-3 times per day.   Do not apply heat to your knee.   Raise the injection area above the level of your heart while you are sitting or lying down.  Activity   Avoid strenuous activities for as long as directed by your health care provider. Ask your health care provider when you can return to your normal activities.  General instructions   Take medicines only as directed by your health care provider.   Do not take aspirin or other over-the-counter medicines unless your health care provider says you can.   Check your injection site every day for signs of infection. Watch for:  ? Redness, swelling, or pain.  ? Fluid, blood, or pus.   Follow your health care provider's instructions about dressing changes and removal.  Contact a health care provider if:   You have symptoms at your injection site that last longer than  two days after your procedure.   You have redness, swelling, or pain in your injection area.   You have fluid, blood, or pus coming from your injection site.   You have warmth in your injection area.   You have a fever.   Your pain is not controlled with medicine.  Get help right away if:   Your knee turns very red.   Your knee becomes very swollen.   Your knee pain is severe.  This information is not intended to replace advice given to you by your health care provider. Make sure you discuss any questions you have with your health care provider.  Document Released: 07/20/2014 Document Revised: 03/04/2016 Document Reviewed: 05/09/2014  Elsevier Interactive Patient Education  2018 Elsevier Inc.

## 2017-07-29 ENCOUNTER — Other Ambulatory Visit: Payer: Self-pay | Admitting: Family

## 2017-07-29 DIAGNOSIS — M25562 Pain in left knee: Secondary | ICD-10-CM

## 2017-08-23 DIAGNOSIS — M25562 Pain in left knee: Secondary | ICD-10-CM | POA: Diagnosis not present

## 2017-08-27 ENCOUNTER — Encounter: Payer: Self-pay | Admitting: Family Medicine

## 2017-08-27 ENCOUNTER — Ambulatory Visit: Payer: BLUE CROSS/BLUE SHIELD | Admitting: Family Medicine

## 2017-08-27 VITALS — BP 115/78 | HR 67 | Temp 97.3°F | Ht 65.0 in | Wt 261.0 lb

## 2017-08-27 DIAGNOSIS — L0291 Cutaneous abscess, unspecified: Secondary | ICD-10-CM

## 2017-08-27 DIAGNOSIS — L720 Epidermal cyst: Secondary | ICD-10-CM

## 2017-08-27 MED ORDER — DOXYCYCLINE HYCLATE 100 MG PO TABS: 100.0000 mg | ORAL_TABLET | Freq: Two times a day (BID) | ORAL | 0 refills | Status: DC

## 2017-08-27 NOTE — Patient Instructions (Signed)
You had an incision and drainage performed today.  I have sent you an antibiotic to use for the next 7 days.  I have also placed a referral to general surgery to have this rechecked and any cysts removed.  Incision and Drainage, Care After Refer to this sheet in the next few weeks. These instructions provide you with information about caring for yourself after your procedure. Your health care provider may also give you more specific instructions. Your treatment has been planned according to current medical practices, but problems sometimes occur. Call your health care provider if you have any problems or questions after your procedure. What can I expect after the procedure? After the procedure, it is common to have:  Pain or discomfort around your incision site.  Drainage from your incision.  Follow these instructions at home:  Take over-the-counter and prescription medicines only as told by your health care provider.  If you were prescribed an antibiotic medicine, take it as told by your health care provider.Do not stop taking the antibiotic even if you start to feel better.  Followinstructions from your health care provider about: ? How to take care of your incision. ? When and how you should change your packing and bandage (dressing). Wash your hands with soap and water before you change your dressing. If soap and water are not available, use hand sanitizer. ? When you should remove your dressing.  Do not take baths, swim, or use a hot tub until your health care provider approves.  Keep all follow-up visits as told by your health care provider. This is important.  Check your incision area every day for signs of infection. Check for: ? More redness, swelling, or pain. ? More fluid or blood. ? Warmth. ? Pus or a bad smell. Contact a health care provider if:  Your cyst or abscess returns.  You have a fever.  You have more redness, swelling, or pain around your incision.  You  have more fluid or blood coming from your incision.  Your incision feels warm to the touch.  You have pus or a bad smell coming from your incision. Get help right away if:  You have severe pain or bleeding.  You cannot eat or drink without vomiting.  You have decreased urine output.  You become short of breath.  You have chest pain.  You cough up blood.  The area where the incision and drainage occurred becomes numb or it tingles. This information is not intended to replace advice given to you by your health care provider. Make sure you discuss any questions you have with your health care provider. Document Released: 09/21/2011 Document Revised: 11/29/2015 Document Reviewed: 04/19/2015 Elsevier Interactive Patient Education  [employer]2018 [employer] Inc.

## 2017-08-27 NOTE — Progress Notes (Signed)
Subjective: CC: boil on shoulder PCP: Remus Loffler, PA-C [id number] Brandy Jefferson is a 42 y.o. female presenting to clinic today for:  1. Boil Patient reports a 3-year history of recurrent cyst/boil on the left shoulder.  She notes that generally these resolve independently but this time, the affected area seems to be getting larger and more red.  Denies fevers, chills, purulence from wound.  She does note pain at the site of the wound.  She notes that this particular flare started on Wednesday and has gradually gotten worse.  She has not used any therapies, including heat or compression.  She is not a diabetic and not on any blood thinners.  No history of allergy to latex, iodine or lidocaine.  Past medical history is significant for seizure disorder and she is on Keppra for this.   ROS: Per HPI  No Known Allergies Past Medical History:  Diagnosis Date  . Arteriovenous malformation of brain    Left temporal  . Partial seizure (HCC) 12/07/2012  . Seizures (HCC)    Partial seizures, right hemisensory    Current Outpatient Medications:  .  cyclobenzaprine (FLEXERIL) 10 MG tablet, Take 1 tablet (10 mg total) by mouth 3 (three) times daily as needed for muscle spasms., Disp: 60 tablet, Rfl: 0 .  diclofenac (VOLTAREN) 75 MG EC tablet, STARTING ON 08/24/16, TAKE 1 TABLET TWICE A DAY AFTER MEALS, Disp: , Rfl: 0 .  hydrochlorothiazide (HYDRODIURIL) 25 MG tablet, TAKE 1 TABLET BY MOUTH EVERY DAY, Disp: 90 tablet, Rfl: 0 .  levETIRAcetam (KEPPRA) 500 MG tablet, TAKE ONE TABLET IN THE MORNING AND TWO AT NIGHT, Disp: 270 tablet, Rfl: 0 .  norgestimate-ethinyl estradiol (ORTHO-CYCLEN,SPRINTEC,PREVIFEM) 0.25-35 MG-MCG tablet, Take 1 tablet by mouth daily., Disp: , Rfl:  Social History   Socioeconomic History  . Marital status: Single    Spouse name: Not on file  . Number of children: 1  . Years of education: 85  . Highest education level: Not on file  Social Needs  . Financial resource  strain: Not on file  . Food insecurity - worry: Not on file  . Food insecurity - inability: Not on file  . Transportation needs - medical: Not on file  . Transportation needs - non-medical: Not on file  Occupational History    Employer: [employer]  Tobacco Use  . Smoking status: Never Smoker  . Smokeless tobacco: Never Used  Substance and Sexual Activity  . Alcohol use: No    Comment: Occasional  . Drug use: No  . Sexual activity: Not on file  Other Topics Concern  . Not on file  Social History Narrative   Patient lives at home with daughter and her daughters dad.    Patient is not married.    Patient has 1 child.    Patient is currently working.    Patient drinks about 5 cups of caffeine daily   Patient is right handed.      Family History  Problem Relation Age of Onset  . Cancer Father     Objective: Office vital signs reviewed. BP 115/78   Pulse 67   Temp (!) 97.3 F (36.3 C) (Oral)   Ht 5\' 5"  (1.651 m)   Wt 261 lb (118.4 kg)   BMI 43.43 kg/m   Physical Examination:  General: Awake, alert, obese, No acute distress Skin: 4 cm x 4cm area of blanching erythema and palpable fluctuance and induration.  Area is tender to palpation.  There is  no central punctum.  Erythema does not extend beyond the lesion.  Incision and Drainage Procedure Note  Pre-operative Diagnosis: abscess  Post-operative Diagnosis: same  Indications: abscess  Anesthesia: 1% plain lidocaine total of 1.5cc used  Procedure Details  The procedure, risks and complications have been discussed in detail (including, but not limited to airway compromise, infection, bleeding) with the patient, and the patient has signed consent to the procedure.  The skin was sterilely prepped and draped over the affected area in the usual fashion. After adequate local anesthesia, I&D with a #10 blade was performed on the left posterior shoulder.  Stab incision performed.  Purulent drainage: present The patient  was observed until stable.  Wound was dressed with pressure bandage patient provided home care instructions.  Findings: Moderate amounts of purulence initiated  EBL: Less than 5 cc's  Drains: None  Condition: Tolerated procedure well and Stable  Complications: none.  Assessment/ Plan: 42 y.o. female   1. Abscess I&D performed here in office.  Wound culture obtained and sent.  Patient was discharged with doxycycline 100 mill grams p.o. twice daily for the next 10 days.  She has Voltaren at home to use twice daily as needed.  Signs and symptoms of more significant infection and complication were reviewed with the patient.  She voiced good understanding.  She is also been referred to general surgery for removal for what I suspect to be cysts.  Home care instructions were reviewed with the patient.  She was good understanding and will follow-up as needed. - Ambulatory referral to General Surgery - Anaerobic and Aerobic Culture  2. Inclusion cyst - Ambulatory referral to General Surgery   Orders Placed This Encounter  Procedures  . Anaerobic and Aerobic Culture  . Ambulatory referral to General Surgery    Referral Priority:   Routine    Referral Type:   Surgical    Referral Reason:   Specialty Services Required    Requested Specialty:   General Surgery    Number of Visits Requested:   1   Meds ordered this encounter  Medications  . doxycycline (VIBRA-TABS) 100 MG tablet    Sig: Take 1 tablet (100 mg total) by mouth 2 (two) times daily.    Dispense:  20 tablet    Refill:  0     [name], DO [facility] [phone]

## 2017-08-30 ENCOUNTER — Other Ambulatory Visit: Payer: Self-pay | Admitting: Neurology

## 2017-08-31 LAB — ANAEROBIC AND AEROBIC CULTURE

## 2017-09-07 ENCOUNTER — Ambulatory Visit: Payer: BLUE CROSS/BLUE SHIELD | Admitting: General Surgery

## 2017-09-09 ENCOUNTER — Ambulatory Visit: Payer: BLUE CROSS/BLUE SHIELD | Admitting: General Surgery

## 2017-09-09 ENCOUNTER — Encounter: Payer: Self-pay | Admitting: General Surgery

## 2017-09-09 VITALS — BP 126/76 | HR 69 | Temp 97.7°F | Resp 18 | Ht 65.0 in | Wt 260.0 lb

## 2017-09-09 DIAGNOSIS — L723 Sebaceous cyst: Secondary | ICD-10-CM

## 2017-09-09 MED ORDER — DOXYCYCLINE HYCLATE 100 MG PO TABS: 100.0000 mg | ORAL_TABLET | Freq: Two times a day (BID) | ORAL | 0 refills | Status: AC

## 2017-09-09 NOTE — Patient Instructions (Signed)
Sebaceous/Epidermal Cyst An epidermal cyst is sometimes called an epidermal inclusion cyst or an infundibular cyst. It is a sac made of skin tissue. The sac contains a substance called keratin. Keratin is a protein that is normally secreted through the hair follicles. When keratin becomes trapped in the top layer of skin (epidermis), it can form an epidermal cyst. Epidermal cysts are usually found on the face, neck, trunk, and genitals. These cysts are usually harmless (benign), and they may not cause symptoms unless they become infected. It is important not to pop epidermal cysts yourself. What are the causes? This condition may be caused by:  A blocked hair follicle.  A hair that curls and re-enters the skin instead of growing straight out of the skin (ingrown hair).  A blocked pore.  Irritated skin.  An injury to the skin.  Certain conditions that are passed along from parent to child (inherited).  Human papillomavirus (HPV).  What increases the risk? The following factors may make you more likely to develop an epidermal cyst:  Having acne.  Being overweight.  Wearing tight clothing.  What are the signs or symptoms? The only symptom of this condition may be a small, painless lump underneath the skin. When an epidermal cyst becomes infected, symptoms may include:  Redness.  Inflammation.  Tenderness.  Warmth.  Fever.  Keratin draining from the cyst. Keratin may look like a grayish-white, bad-smelling substance.  Pus draining from the cyst.  How is this diagnosed? This condition is diagnosed with a physical exam. In some cases, you may have a sample of tissue (biopsy) taken from your cyst to be examined under a microscope or tested for bacteria. You may be referred to a health care provider who specializes in skin care (dermatologist). How is this treated? In many cases, epidermal cysts go away on their own without treatment. If a cyst becomes infected, treatment may  include:  Opening and draining the cyst. After draining, minor surgery to remove the rest of the cyst may be done.  Antibiotic medicine to help prevent infection.  Injections of medicines (steroids) that help to reduce inflammation.  Surgery to remove the cyst. Surgery may be done if: ? The cyst becomes large. ? The cyst bothers you. ? There is a chance that the cyst could turn into cancer.  Follow these instructions at home:  Take over-the-counter and prescription medicines only as told by your health care provider.  If you were prescribed an antibiotic, use it as told by your health care provider. Do not stop using the antibiotic even if you start to feel better.  Keep the area around your cyst clean and dry.  Wear loose, dry clothing.  Do not try to pop your cyst.  Avoid touching your cyst.  Check your cyst every day for signs of infection.  Keep all follow-up visits as told by your health care provider. This is important. How is this prevented?  Wear clean, dry, clothing.  Avoid wearing tight clothing.  Keep your skin clean and dry. Shower or take baths every day.  Wash your body with a benzoyl peroxide wash when you shower or bathe. Contact a health care provider if:  Your cyst develops symptoms of infection.  Your condition is not improving or is getting worse.  You develop a cyst that looks different from other cysts you have had.  You have a fever. Get help right away if:  Redness spreads from the cyst into the surrounding area. This information is   not intended to replace advice given to you by your health care provider. Make sure you discuss any questions you have with your health care provider. Document Released: 05/30/2004 Document Revised: 02/26/2016 Document Reviewed: 05/01/2015 Elsevier Interactive Patient Education  2018 Elsevier Inc.  

## 2017-09-09 NOTE — Progress Notes (Signed)
[facility] History and Physical  Reason for Referral: Infected cyst on back  Referring Physician:  Dr. Nadine CountsGottschalk   Chief Complaint    Abscess      Brandy Jefferson is a 42 y.o. female.  HPI: Brandy Jefferson is a 42 yo with a history of seizures, possibly a AVM per her report s/p [facility] who has not had seizures in years. She comes in with an area on the left back that periodically gets infected and has to be I&D. This area become tender and swollen and red. She reports no other sites of infection. She recently saw her PCP and was prescribed doxycycline for the infection and had an I&D of the area.  Past Medical History:  Diagnosis Date  . Arteriovenous malformation of brain    Left temporal  . Partial seizure (HCC) 12/07/2012  . Seizures (HCC)    Partial seizures, right hemisensory    Past Surgical History:  Procedure Laterality Date  . Arteriovenous malformation ablation     Left temporal, intravascular procedure   Melanoma metastatic  Family History  Problem Relation Age of Onset  . Cancer Father     Social History   Tobacco Use  . Smoking status: Never Smoker  . Smokeless tobacco: Never Used  Substance Use Topics  . Alcohol use: No    Comment: Occasional  . Drug use: No    Medications: I have reviewed the patient's current medications.  Allergies as of 09/09/2017   No Known Allergies     Medication List        Accurate as of 09/09/17  3:21 PM. Always use your most recent med list.          cyclobenzaprine 10 MG tablet Commonly known as:  FLEXERIL Take 1 tablet (10 mg total) by mouth 3 (three) times daily as needed for muscle spasms.   diclofenac 75 MG EC tablet Commonly known as:  VOLTAREN STARTING ON 08/24/16, TAKE 1 TABLET TWICE A DAY AFTER MEALS   doxycycline 100 MG tablet Commonly known as:  VIBRA-TABS Take 1 tablet (100 mg total) by mouth 2 (two) times daily.   hydrochlorothiazide 25 MG tablet Commonly known  as:  HYDRODIURIL TAKE 1 TABLET BY MOUTH EVERY DAY   levETIRAcetam 500 MG tablet Commonly known as:  KEPPRA TAKE ONE TABLET IN THE MORNING AND TWO AT NIGHT. Call to schedule f/u to receive any more future refills   norgestimate-ethinyl estradiol 0.25-35 MG-MCG tablet Commonly known as:  ORTHO-CYCLEN,SPRINTEC,PREVIFEM Take 1 tablet by mouth daily.        ROS:  A comprehensive review of systems was negative except for: Neurological: positive for seizures abscess on the left back/ boils  Blood pressure 126/76, pulse 69, temperature 97.7 F (36.5 C), resp. rate 18, height 5\' 5"  (1.651 m), weight 260 lb (117.9 kg). Physical Exam  Constitutional: She is oriented to person, place, and time and well-developed, well-nourished, and in no distress.  HENT:  Head: Normocephalic.  Eyes: Pupils are equal, round, and reactive to light.  Neck: Normal range of motion.  Cardiovascular: Normal rate and regular rhythm.  Pulmonary/Chest: Effort normal and breath sounds normal.  Left back with swollen area with fluctuance, erythematous   Abdominal: Soft. Bowel sounds are normal.  Musculoskeletal: Normal range of motion.  Neurological: She is alert and oriented to person, place, and time.  Skin: Skin is warm and dry.  Psychiatric: Mood, memory, affect and judgment normal.  Vitals reviewed.  Results: None  Assessment & Plan:  Brandy Jefferson is a 42 y.o. female with a sebaceous/ epidermoid cyst that has been getting infected and causing her issues. She would like to get it removed. - OR for removal of the cyst, 2cm on the left back  - Doxycycline 5 day course prescribed to start 5 days prior to surgery to aid in getting inflammation down leading up to removal so that we can hopefully get the whole cyst  All questions were answered to the satisfaction of the patient.  The risk and benefits of sebaceous cyst removal were discussed including but not limited to bleeding, infection, risk of  incomplete removal and recurrence.  After careful consideration, Brandy Jefferson has decided to proceed.    [name] 09/09/2017, 3:21 PM

## 2017-09-09 NOTE — H&P (Signed)
[facility] History and Physical  Reason for Referral: Infected cyst on back  Referring Physician:  Dr. Gottschalk   Chief Complaint    Abscess      Brandy Jefferson is a 41 y.o. female.  HPI: Brandy Jefferson is a 41 yo with a history of seizures, possibly a AVM per her report s/p surgery at [facility] and [facility] who has not had seizures in years. She comes in with an area on the left back that periodically gets infected and has to be I&D. This area become tender and swollen and red. She reports no other sites of infection. She recently saw her PCP and was prescribed doxycycline for the infection and had an I&D of the area.  Past Medical History:  Diagnosis Date  . Arteriovenous malformation of brain    Left temporal  . Partial seizure (HCC) 12/07/2012  . Seizures (HCC)    Partial seizures, right hemisensory    Past Surgical History:  Procedure Laterality Date  . Arteriovenous malformation ablation     Left temporal, intravascular procedure   Melanoma metastatic  Family History  Problem Relation Age of Onset  . Cancer Father     Social History   Tobacco Use  . Smoking status: Never Smoker  . Smokeless tobacco: Never Used  Substance Use Topics  . Alcohol use: No    Comment: Occasional  . Drug use: No    Medications: I have reviewed the patient's current medications.  Allergies as of 09/09/2017   No Known Allergies     Medication List        Accurate as of 09/09/17  3:21 PM. Always use your most recent med list.          cyclobenzaprine 10 MG tablet Commonly known as:  FLEXERIL Take 1 tablet (10 mg total) by mouth 3 (three) times daily as needed for muscle spasms.   diclofenac 75 MG EC tablet Commonly known as:  VOLTAREN STARTING ON 08/24/16, TAKE 1 TABLET TWICE A DAY AFTER MEALS   doxycycline 100 MG tablet Commonly known as:  VIBRA-TABS Take 1 tablet (100 mg total) by mouth 2 (two) times daily.   hydrochlorothiazide 25 MG tablet Commonly known  as:  HYDRODIURIL TAKE 1 TABLET BY MOUTH EVERY DAY   levETIRAcetam 500 MG tablet Commonly known as:  KEPPRA TAKE ONE TABLET IN THE MORNING AND TWO AT NIGHT. Call to schedule f/u to receive any more future refills   norgestimate-ethinyl estradiol 0.25-35 MG-MCG tablet Commonly known as:  ORTHO-CYCLEN,SPRINTEC,PREVIFEM Take 1 tablet by mouth daily.        ROS:  A comprehensive review of systems was negative except for: Neurological: positive for seizures abscess on the left back/ boils  Blood pressure 126/76, pulse 69, temperature 97.7 F (36.5 C), resp. rate 18, height 5' 5" (1.651 m), weight 260 lb (117.9 kg). Physical Exam  Constitutional: She is oriented to person, place, and time and well-developed, well-nourished, and in no distress.  HENT:  Head: Normocephalic.  Eyes: Pupils are equal, round, and reactive to light.  Neck: Normal range of motion.  Cardiovascular: Normal rate and regular rhythm.  Pulmonary/Chest: Effort normal and breath sounds normal.  Left back with swollen area with fluctuance, erythematous   Abdominal: Soft. Bowel sounds are normal.  Musculoskeletal: Normal range of motion.  Neurological: She is alert and oriented to person, place, and time.  Skin: Skin is warm and dry.  Psychiatric: Mood, memory, affect and judgment normal.  Vitals reviewed.     Results: None  Assessment & Plan:  Brandy Jefferson is a 41 y.o. female with a sebaceous/ epidermoid cyst that has been getting infected and causing her issues. She would like to get it removed. - OR for removal of the cyst, 2cm on the left back  - Doxycycline 5 day course prescribed to start 5 days prior to surgery to aid in getting inflammation down leading up to removal so that we can hopefully get the whole cyst  All questions were answered to the satisfaction of the patient.  The risk and benefits of sebaceous cyst removal were discussed including but not limited to bleeding, infection, risk of  incomplete removal and recurrence.  After careful consideration, Stephie N Steinborn has decided to proceed.    Tayden Duran C Khayree Delellis 09/09/2017, 3:21 PM       

## 2017-09-17 NOTE — Patient Instructions (Signed)
Brandy Jefferson  09/17/2017     @PREFPERIOPPHARMACY @   Your procedure is scheduled on  09/29/2017 .  [facility]   [facility].  Call this number if you have problems the morning of surgery:  [phone]   Remember:  Do not eat food or drink liquids after midnight.  Take these medicines the morning of surgery with A SIP OF WATER  Flexaril, keppra.   Do not wear jewelry, make-up or nail polish.  Do not wear lotions, powders, or perfumes, or deodorant.  Do not shave 48 hours prior to surgery.  Men may shave face and neck.  Do not bring valuables to the hospital.  [facility] is not responsible for any belongings or valuables.  Contacts, dentures or bridgework may not be worn into surgery.  Leave your suitcase in the car.  After surgery it may be brought to your room.  For patients admitted to the hospital, discharge time will be determined by your treatment team.  Patients discharged the day of surgery will not be allowed to drive home.   Name and phone number of your driver:   family Special instructions:  None  Please read over the following fact sheets that you were given. Anesthesia Post-op Instructions and Care and Recovery After Surgery      Epidermal Cyst Removal Epidermal cyst removal is a procedure to remove a sac of oily material that forms under your skin (epidermal cyst). Epidermal cysts may also be called epidermoid cysts or keratin cysts. Normally, the skin secretes this oily material through a gland or a hair follicle. This type of cyst usually results when a skin gland or hair follicle becomes blocked. You may need this procedure if you have an epidermal cyst that becomes large, uncomfortable, or infected. Tell a health care provider about:  Any allergies you have.  All medicines you are taking, including vitamins, herbs, eye drops, creams, and over-the-counter medicines.  Any problems you or family members have had with  anesthetic medicines.  Any blood disorders you have.  Any surgeries you have had.  Any medical conditions you have. What are the risks? Generally, this is a safe procedure. However, problems may occur, including:  Developing another cyst.  Bleeding.  Infection.  Scarring.  What happens before the procedure?  Ask your health care provider about: ? Changing or stopping your regular medicines. This is especially important if you are taking diabetes medicines or blood thinners. ? Taking medicines such as aspirin and ibuprofen. These medicines can thin your blood. Do not take these medicines before your procedure if your health care provider instructs you not to.  If you have an infected cyst, you may have to take antibiotic medicines before or after the cyst removal. Take your antibiotics as directed by your health care provider. Finish all of the medicine even if you start to feel better.  Take a shower on the morning of your procedure. Your health care provider may ask you to use a germ-killing (antiseptic) soap. What happens during the procedure?  You will be given a medicine that numbs the area (local anesthetic).  The skin around the cyst will be cleaned with a germ-killing solution (antiseptic).  Your health care provider will make a small surgical incision over the cyst.  The cyst will be separated from the surrounding tissues that are under your skin.  If possible, the cyst will be removed undamaged (  intact).  If the cyst bursts (ruptures), it will need to be removed in pieces.  After the cyst is removed, your health care provider will control any bleeding and close the incision with small stitches (sutures). Small incisions may not need sutures, and the bleeding will be controlled by applying direct pressure with gauze.  Your health care provider may apply antibiotic ointment and a light bandage (dressing) over the incision. This procedure may vary among health care  providers and hospitals. What happens after the procedure?  If your cyst ruptured during surgery, you may need to take antibiotic medicine. If you were prescribed an antibiotic medicine, finish all of it even if you start to feel better. This information is not intended to replace advice given to you by your health care provider. Make sure you discuss any questions you have with your health care provider. Document Released: 06/26/2000 Document Revised: 12/05/2015 Document Reviewed: 03/14/2014 Elsevier Interactive Patient Education  2018 [place]. Epidermal Cyst Removal, Care After Refer to this sheet in the next few weeks. These instructions provide you with information about caring for yourself after your procedure. Your health care provider may also give you more specific instructions. Your treatment has been planned according to current medical practices, but problems sometimes occur. Call your health care provider if you have any problems or questions after your procedure. What can I expect after the procedure? After the procedure, it is common to have:  Soreness in the area where your cyst was removed.  Tightness or itching from your skin sutures.  Follow these instructions at home:  Take medicines only as directed by your health care provider.  If you were prescribed an antibiotic medicine, finish all of it even if you start to feel better.  Use antibiotic ointment as directed by your health care provider. Follow the instructions carefully.  There are many different ways to close and cover an incision, including stitches (sutures), skin glue, and adhesive strips. Follow your health care provider's instructions about: ? Incision care. ? Bandage (dressing) changes and removal. ? Incision closure removal.  Keep the bandage (dressing) dry until your health care provider says that it can be removed. Take sponge baths only. Ask your health care provider when you can start showering  or taking a bath.  After your dressing is off, check your incision every day for signs of infection. Watch for: ? Redness, swelling, or pain. ? Fluid, blood, or pus.  You can return to your normal activities. Do not do anything that stretches or puts pressure on your incision.  You can return to your normal diet.  Keep all follow-up visits as directed by your health care provider. This is important. Contact a health care provider if:  You have a fever.  Your incision bleeds.  You have redness, swelling, or pain in the incision area.  You have fluid, blood, or pus coming from your incision.  Your cyst comes back after surgery. This information is not intended to replace advice given to you by your health care provider. Make sure you discuss any questions you have with your health care provider. Document Released: 07/20/2014 Document Revised: 12/05/2015 Document Reviewed: 03/14/2014 Elsevier Interactive Patient Education  2018 [place] Anesthesia is a term that refers to techniques, procedures, and medicines that help a person stay safe and comfortable during a medical procedure. Monitored anesthesia care, or sedation, is one type of anesthesia. Your anesthesia specialist may recommend sedation if you will be  having a procedure that does not require you to be unconscious, such as:  Cataract surgery.  A dental procedure.  A biopsy.  A colonoscopy.  During the procedure, you may receive a medicine to help you relax (sedative). There are three levels of sedation:  Mild sedation. At this level, you may feel awake and relaxed. You will be able to follow directions.  Moderate sedation. At this level, you will be sleepy. You may not remember the procedure.  Deep sedation. At this level, you will be asleep. You will not remember the procedure.  The more medicine you are given, the deeper your level of sedation will be. Depending on how you respond  to the procedure, the anesthesia specialist may change your level of sedation or the type of anesthesia to fit your needs. An anesthesia specialist will monitor you closely during the procedure. Let your health care provider know about:  Any allergies you have.  All medicines you are taking, including vitamins, herbs, eye drops, creams, and over-the-counter medicines.  Any use of steroids (by mouth or as a cream).  Any problems you or family members have had with sedatives and anesthetic medicines.  Any blood disorders you have.  Any surgeries you have had.  Any medical conditions you have, such as sleep apnea.  Whether you are pregnant or may be pregnant.  Any use of cigarettes, alcohol, or street drugs. What are the risks? Generally, this is a safe procedure. However, problems may occur, including:  Getting too much medicine (oversedation).  Nausea.  Allergic reaction to medicines.  Trouble breathing. If this happens, a breathing tube may be used to help with breathing. It will be removed when you are awake and breathing on your own.  Heart trouble.  Lung trouble.  Before the procedure Staying hydrated Follow instructions from your health care provider about hydration, which may include:  Up to 2 hours before the procedure - you may continue to drink clear liquids, such as water, clear fruit juice, black coffee, and plain tea.  Eating and drinking restrictions Follow instructions from your health care provider about eating and drinking, which may include:  8 hours before the procedure - stop eating heavy meals or foods such as meat, fried foods, or fatty foods.  6 hours before the procedure - stop eating light meals or foods, such as toast or cereal.  6 hours before the procedure - stop drinking milk or drinks that contain milk.  2 hours before the procedure - stop drinking clear liquids.  Medicines Ask your health care provider about:  Changing or stopping  your regular medicines. This is especially important if you are taking diabetes medicines or blood thinners.  Taking medicines such as aspirin and ibuprofen. These medicines can thin your blood. Do not take these medicines before your procedure if your health care provider instructs you not to.  Tests and exams  You will have a physical exam.  You may have blood tests done to show: ? How well your kidneys and liver are working. ? How well your blood can clot.  General instructions  Plan to have someone take you home from the hospital or clinic.  If you will be going home right after the procedure, plan to have someone with you for 24 hours.  What happens during the procedure?  Your blood pressure, heart rate, breathing, level of pain and overall condition will be monitored.  An IV tube will be inserted into one of your veins.  Your anesthesia specialist will give you medicines as needed to keep you comfortable during the procedure. This may mean changing the level of sedation.  The procedure will be performed. After the procedure  Your blood pressure, heart rate, breathing rate, and blood oxygen level will be monitored until the medicines you were given have worn off.  Do not drive for 24 hours if you received a sedative.  You may: ? Feel sleepy, clumsy, or nauseous. ? Feel forgetful about what happened after the procedure. ? Have a sore throat if you had a breathing tube during the procedure. ? Vomit. This information is not intended to replace advice given to you by your health care provider. Make sure you discuss any questions you have with your health care provider. Document Released: 03/25/2005 Document Revised: 12/06/2015 Document Reviewed: 10/20/2015 Elsevier Interactive Patient Education  2018 [place], Care After These instructions provide you with information about caring for yourself after your procedure. Your health care provider  may also give you more specific instructions. Your treatment has been planned according to current medical practices, but problems sometimes occur. Call your health care provider if you have any problems or questions after your procedure. What can I expect after the procedure? After your procedure, it is common to:  Feel sleepy for several hours.  Feel clumsy and have poor balance for several hours.  Feel forgetful about what happened after the procedure.  Have poor judgment for several hours.  Feel nauseous or vomit.  Have a sore throat if you had a breathing tube during the procedure.  Follow these instructions at home: For at least 24 hours after the procedure:   Do not: ? Participate in activities in which you could fall or become injured. ? Drive. ? Use heavy machinery. ? Drink alcohol. ? Take sleeping pills or medicines that cause drowsiness. ? Make important decisions or sign legal documents. ? Take care of children on your own.  Rest. Eating and drinking  Follow the diet that is recommended by your health care provider.  If you vomit, drink water, juice, or soup when you can drink without vomiting.  Make sure you have little or no nausea before eating solid foods. General instructions  Have a responsible adult stay with you until you are awake and alert.  Take over-the-counter and prescription medicines only as told by your health care provider.  If you smoke, do not smoke without supervision.  Keep all follow-up visits as told by your health care provider. This is important. Contact a health care provider if:  You keep feeling nauseous or you keep vomiting.  You feel light-headed.  You develop a rash.  You have a fever. Get help right away if:  You have trouble breathing. This information is not intended to replace advice given to you by your health care provider. Make sure you discuss any questions you have with your health care provider. Document  Released: 10/20/2015 Document Revised: 02/19/2016 Document Reviewed: 10/20/2015 Elsevier Interactive Patient Education  [employer].

## 2017-09-21 ENCOUNTER — Encounter (HOSPITAL_COMMUNITY): Payer: Self-pay

## 2017-09-21 ENCOUNTER — Other Ambulatory Visit: Payer: Self-pay

## 2017-09-21 ENCOUNTER — Encounter (HOSPITAL_COMMUNITY)
Admission: RE | Admit: 2017-09-21 | Discharge: 2017-09-21 | Disposition: A | Discharge status: Home / Self Care | Payer: BLUE CROSS/BLUE SHIELD | Source: Ambulatory Visit | Attending: General Surgery | Admitting: General Surgery

## 2017-09-21 DIAGNOSIS — L728 Other follicular cysts of the skin and subcutaneous tissue: Secondary | ICD-10-CM | POA: Insufficient documentation

## 2017-09-21 DIAGNOSIS — Z01812 Encounter for preprocedural laboratory examination: Secondary | ICD-10-CM | POA: Insufficient documentation

## 2017-09-21 DIAGNOSIS — Z01818 Encounter for other preprocedural examination: Secondary | ICD-10-CM | POA: Insufficient documentation

## 2017-09-21 HISTORY — DX: Plantar fascial fibromatosis: M72.2

## 2017-09-21 LAB — BASIC METABOLIC PANEL
ANION GAP: 9 (ref 5–15)
BUN: 15 mg/dL (ref 6–20)
CALCIUM: 8.5 mg/dL — AB (ref 8.9–10.3)
CO2: 23 mmol/L (ref 22–32)
Chloride: 107 mmol/L (ref 101–111)
Creatinine, Ser: 0.65 mg/dL (ref 0.44–1.00)
Glucose, Bld: 96 mg/dL (ref 65–99)
POTASSIUM: 3.7 mmol/L (ref 3.5–5.1)
Sodium: 139 mmol/L (ref 135–145)

## 2017-09-21 LAB — CBC WITH DIFFERENTIAL/PLATELET
BASOS ABS: 0.1 10*3/uL (ref 0.0–0.1)
BASOS PCT: 1 %
Eosinophils Absolute: 0.2 10*3/uL (ref 0.0–0.7)
Eosinophils Relative: 3 %
HEMATOCRIT: 41.7 % (ref 36.0–46.0)
Hemoglobin: 13 g/dL (ref 12.0–15.0)
LYMPHS PCT: 24 %
Lymphs Abs: 1.7 10*3/uL (ref 0.7–4.0)
MCH: 28.6 pg (ref 26.0–34.0)
MCHC: 31.2 g/dL (ref 30.0–36.0)
MCV: 91.6 fL (ref 78.0–100.0)
Monocytes Absolute: 0.6 10*3/uL (ref 0.1–1.0)
Monocytes Relative: 8 %
NEUTROS ABS: 4.5 10*3/uL (ref 1.7–7.7)
Neutrophils Relative %: 64 %
PLATELETS: 370 10*3/uL (ref 150–400)
RBC: 4.55 MIL/uL (ref 3.87–5.11)
RDW: 13.7 % (ref 11.5–15.5)
WBC: 7.1 10*3/uL (ref 4.0–10.5)

## 2017-09-21 LAB — SURGICAL PCR SCREEN
MRSA, PCR: NEGATIVE
STAPHYLOCOCCUS AUREUS: NEGATIVE

## 2017-09-21 LAB — HCG, SERUM, QUALITATIVE: Preg, Serum: NEGATIVE

## 2017-09-22 ENCOUNTER — Other Ambulatory Visit (HOSPITAL_COMMUNITY): Payer: PRIVATE HEALTH INSURANCE

## 2017-09-29 ENCOUNTER — Ambulatory Visit (HOSPITAL_COMMUNITY): Payer: BLUE CROSS/BLUE SHIELD | Admitting: Anesthesiology

## 2017-09-29 ENCOUNTER — Encounter (HOSPITAL_COMMUNITY): Payer: Self-pay | Admitting: *Deleted

## 2017-09-29 ENCOUNTER — Encounter (HOSPITAL_COMMUNITY)
Admission: RE | Disposition: A | Discharge status: Home / Self Care | Payer: Self-pay | Source: Ambulatory Visit | Attending: General Surgery

## 2017-09-29 ENCOUNTER — Ambulatory Visit (HOSPITAL_COMMUNITY)
Admission: RE | Admit: 2017-09-29 | Discharge: 2017-09-29 | Disposition: A | Discharge status: Home / Self Care | Payer: BLUE CROSS/BLUE SHIELD | Source: Ambulatory Visit | Attending: General Surgery | Admitting: General Surgery

## 2017-09-29 DIAGNOSIS — Z79899 Other long term (current) drug therapy: Secondary | ICD-10-CM | POA: Insufficient documentation

## 2017-09-29 DIAGNOSIS — G40802 Other epilepsy, not intractable, without status epilepticus: Secondary | ICD-10-CM | POA: Insufficient documentation

## 2017-09-29 DIAGNOSIS — L723 Sebaceous cyst: Secondary | ICD-10-CM | POA: Insufficient documentation

## 2017-09-29 DIAGNOSIS — L729 Follicular cyst of the skin and subcutaneous tissue, unspecified: Secondary | ICD-10-CM | POA: Diagnosis not present

## 2017-09-29 HISTORY — PX: MASS EXCISION: SHX2000

## 2017-09-29 SURGERY — EXCISION MASS
Anesthesia: General

## 2017-09-29 MED ORDER — ONDANSETRON HCL 4 MG/2ML IJ SOLN
INTRAMUSCULAR | Status: AC
  Filled 2017-09-29: qty 2

## 2017-09-29 MED ORDER — PHENYLEPHRINE HCL 10 MG/ML IJ SOLN
INTRAMUSCULAR | Status: DC | PRN
  Administered 2017-09-29: 100 ug via INTRAVENOUS

## 2017-09-29 MED ORDER — ONDANSETRON HCL 4 MG/2ML IJ SOLN
4.0000 mg | Freq: Once | INTRAMUSCULAR | Status: AC
  Administered 2017-09-29: 4 mg via INTRAVENOUS

## 2017-09-29 MED ORDER — SUCCINYLCHOLINE CHLORIDE 200 MG/10ML IV SOSY
PREFILLED_SYRINGE | INTRAVENOUS | Status: DC | PRN
  Administered 2017-09-29: 120 mg via INTRAVENOUS

## 2017-09-29 MED ORDER — PHENYLEPHRINE HCL 10 MG/ML IJ SOLN
INTRAMUSCULAR | Status: AC
  Filled 2017-09-29: qty 1

## 2017-09-29 MED ORDER — PROPOFOL 10 MG/ML IV BOLUS
INTRAVENOUS | Status: AC
  Filled 2017-09-29: qty 20

## 2017-09-29 MED ORDER — BUPIVACAINE HCL (PF) 0.5 % IJ SOLN
INTRAMUSCULAR | Status: DC | PRN
Start: 2017-09-29 — End: 2017-09-29
  Administered 2017-09-29: 10 mL

## 2017-09-29 MED ORDER — SUCCINYLCHOLINE CHLORIDE 20 MG/ML IJ SOLN
INTRAMUSCULAR | Status: AC
  Filled 2017-09-29: qty 1

## 2017-09-29 MED ORDER — CEFAZOLIN SODIUM-DEXTROSE 2-4 GM/100ML-% IV SOLN
2.0000 g | INTRAVENOUS | Status: AC
  Administered 2017-09-29: 2 g via INTRAVENOUS

## 2017-09-29 MED ORDER — FENTANYL CITRATE (PF) 250 MCG/5ML IJ SOLN
INTRAMUSCULAR | Status: AC
  Filled 2017-09-29: qty 5

## 2017-09-29 MED ORDER — DEXAMETHASONE SODIUM PHOSPHATE 4 MG/ML IJ SOLN
4.0000 mg | Freq: Once | INTRAMUSCULAR | Status: AC
  Administered 2017-09-29: 4 mg via INTRAVENOUS

## 2017-09-29 MED ORDER — MIDAZOLAM HCL 2 MG/2ML IJ SOLN
INTRAMUSCULAR | Status: AC
  Filled 2017-09-29: qty 2

## 2017-09-29 MED ORDER — DEXAMETHASONE SODIUM PHOSPHATE 4 MG/ML IJ SOLN
INTRAMUSCULAR | Status: AC
  Filled 2017-09-29: qty 1

## 2017-09-29 MED ORDER — FENTANYL CITRATE (PF) 100 MCG/2ML IJ SOLN
INTRAMUSCULAR | Status: DC | PRN
  Administered 2017-09-29: 50 ug via INTRAVENOUS
  Administered 2017-09-29 (×2): 100 ug via INTRAVENOUS

## 2017-09-29 MED ORDER — 0.9 % SODIUM CHLORIDE (POUR BTL) OPTIME
TOPICAL | Status: DC | PRN
  Administered 2017-09-29: 1000 mL

## 2017-09-29 MED ORDER — BUPIVACAINE HCL (PF) 0.5 % IJ SOLN
INTRAMUSCULAR | Status: AC
  Filled 2017-09-29: qty 30

## 2017-09-29 MED ORDER — MIDAZOLAM HCL 2 MG/2ML IJ SOLN
1.0000 mg | INTRAMUSCULAR | Status: AC
  Administered 2017-09-29: 2 mg via INTRAVENOUS

## 2017-09-29 MED ORDER — CEFAZOLIN SODIUM-DEXTROSE 2-4 GM/100ML-% IV SOLN
INTRAVENOUS | Status: AC
  Filled 2017-09-29: qty 100

## 2017-09-29 MED ORDER — CHLORHEXIDINE GLUCONATE CLOTH 2 % EX PADS: 6.0000 | MEDICATED_PAD | Freq: Once | CUTANEOUS | Status: DC

## 2017-09-29 MED ORDER — LIDOCAINE HCL (CARDIAC) 20 MG/ML IV SOLN
INTRAVENOUS | Status: DC | PRN
  Administered 2017-09-29: 40 mg via INTRAVENOUS

## 2017-09-29 MED ORDER — OXYCODONE HCL 5 MG PO TABS: 5.0000 mg | ORAL_TABLET | Freq: Three times a day (TID) | ORAL | 0 refills | Status: DC | PRN

## 2017-09-29 MED ORDER — FENTANYL CITRATE (PF) 100 MCG/2ML IJ SOLN: 25.0000 ug | INTRAMUSCULAR | Status: DC | PRN

## 2017-09-29 MED ORDER — LACTATED RINGERS IV SOLN
INTRAVENOUS | Status: DC
  Administered 2017-09-29: 1000 mL via INTRAVENOUS

## 2017-09-29 MED ORDER — BUPIVACAINE LIPOSOME 1.3 % IJ SUSP
INTRAMUSCULAR | Status: AC
  Filled 2017-09-29: qty 20

## 2017-09-29 MED ORDER — PROPOFOL 10 MG/ML IV BOLUS
INTRAVENOUS | Status: DC | PRN
  Administered 2017-09-29: 50 mg via INTRAVENOUS
  Administered 2017-09-29: 200 mg via INTRAVENOUS

## 2017-09-29 SURGICAL SUPPLY — 35 items
BAG HAMPER (MISCELLANEOUS) ×2 IMPLANT
CHLORAPREP W/TINT 10.5 ML (MISCELLANEOUS) ×2 IMPLANT
CLOTH BEACON ORANGE TIMEOUT ST (SAFETY) ×2 IMPLANT
COVER LIGHT HANDLE STERIS (MISCELLANEOUS) ×4 IMPLANT
DECANTER SPIKE VIAL GLASS SM (MISCELLANEOUS) ×2 IMPLANT
DERMABOND ADVANCED (GAUZE/BANDAGES/DRESSINGS) ×1
DERMABOND ADVANCED .7 DNX12 (GAUZE/BANDAGES/DRESSINGS) ×1 IMPLANT
DRAPE EENT ADH APERT 31X51 STR (DRAPES) IMPLANT
ELECT NEEDLE TIP 2.8 STRL (NEEDLE) IMPLANT
ELECT REM PT RETURN 9FT ADLT (ELECTROSURGICAL) ×2
ELECTRODE REM PT RTRN 9FT ADLT (ELECTROSURGICAL) ×1 IMPLANT
GLOVE BIO SURGEON STRL SZ 6.5 (GLOVE) ×2 IMPLANT
GLOVE BIOGEL M 6.5 STRL (GLOVE) ×2 IMPLANT
GLOVE BIOGEL PI IND STRL 6.5 (GLOVE) ×2 IMPLANT
GLOVE BIOGEL PI IND STRL 7.0 (GLOVE) ×2 IMPLANT
GLOVE BIOGEL PI INDICATOR 6.5 (GLOVE) ×2
GLOVE BIOGEL PI INDICATOR 7.0 (GLOVE) ×2
GOWN STRL REUS W/ TWL XL LVL3 (GOWN DISPOSABLE) ×1 IMPLANT
GOWN STRL REUS W/TWL LRG LVL3 (GOWN DISPOSABLE) ×2 IMPLANT
GOWN STRL REUS W/TWL XL LVL3 (GOWN DISPOSABLE) ×1
KIT TURNOVER KIT A (KITS) ×2 IMPLANT
MANIFOLD NEPTUNE II (INSTRUMENTS) ×2 IMPLANT
NEEDLE HYPO 25X1 1.5 SAFETY (NEEDLE) ×2 IMPLANT
NS IRRIG 1000ML POUR BTL (IV SOLUTION) ×2 IMPLANT
PACK MINOR (CUSTOM PROCEDURE TRAY) IMPLANT
PAD ARMBOARD 7.5X6 YLW CONV (MISCELLANEOUS) ×2 IMPLANT
SET BASIN LINEN APH (SET/KITS/TRAYS/PACK) ×2 IMPLANT
SPONGE GAUZE 2X2 8PLY STRL LF (GAUZE/BANDAGES/DRESSINGS) ×2 IMPLANT
SUT ETHILON 3 0 FSL (SUTURE) IMPLANT
SUT MNCRL AB 4-0 PS2 18 (SUTURE) ×2 IMPLANT
SUT PROLENE 4 0 PS 2 18 (SUTURE) IMPLANT
SUT VIC AB 3-0 SH 27 (SUTURE) ×1
SUT VIC AB 3-0 SH 27X BRD (SUTURE) ×1 IMPLANT
SYR CONTROL 10ML LL (SYRINGE) ×2 IMPLANT
TAPE CLOTH SURG 4X10 WHT LF (GAUZE/BANDAGES/DRESSINGS) ×2 IMPLANT

## 2017-09-29 NOTE — Anesthesia Preprocedure Evaluation (Signed)
Anesthesia Evaluation  Patient identified by MRN, date of birth, ID band Patient awake    Reviewed: Allergy & Precautions, NPO status , Patient's Chart, lab work & pertinent test results  Airway Mallampati: II  TM Distance: >3 FB Neck ROM: Full    Dental  (+) Poor Dentition, Chipped, Dental Advisory Given,    Pulmonary neg pulmonary ROS,    breath sounds clear to auscultation       Cardiovascular negative cardio ROS   Rhythm:Regular Rate:Normal     Neuro/Psych Seizures -, Well Controlled,  Arteriovenous malformation ablation 2004  negative psych ROS   GI/Hepatic negative GI ROS, Neg liver ROS,   Endo/Other  Morbid obesity  Renal/GU negative Renal ROS     Musculoskeletal   Abdominal   Peds  Hematology   Anesthesia Other Findings   Reproductive/Obstetrics                             Anesthesia Physical Anesthesia Plan  ASA: III  Anesthesia Plan: General   Post-op Pain Management:    Induction: Intravenous, Rapid sequence and Cricoid pressure planned  PONV Risk Score and Plan:   Airway Management Planned: Oral ETT  Additional Equipment:   Intra-op Plan:   Post-operative Plan: Extubation in OR  Informed Consent: I have reviewed the patients History and Physical, chart, labs and discussed the procedure including the risks, benefits and alternatives for the proposed anesthesia with the patient or authorized representative who has indicated his/her understanding and acceptance.     Plan Discussed with:   Anesthesia Plan Comments: (RLD position )        Anesthesia Quick Evaluation

## 2017-09-29 NOTE — Anesthesia Procedure Notes (Signed)
Procedure Name: Intubation Date/Time: 09/29/2017 7:35 AM Performed by: Jonna Munro, CRNA Pre-anesthesia Checklist: Patient identified, Emergency Drugs available, Suction available, Patient being monitored and Timeout performed Patient Re-evaluated:Patient Re-evaluated prior to induction Oxygen Delivery Method: Circle system utilized Preoxygenation: Pre-oxygenation with 100% oxygen Induction Type: IV induction Laryngoscope Size: Mac and 3 Grade View: Grade I Tube type: Oral Tube size: 7.0 mm Number of attempts: 1 Airway Equipment and Method: Stylet Placement Confirmation: ETT inserted through vocal cords under direct vision,  positive ETCO2 and breath sounds checked- equal and bilateral Secured at: 22 cm Tube secured with: Tape Dental Injury: Teeth and Oropharynx as per pre-operative assessment

## 2017-09-29 NOTE — Interval H&P Note (Signed)
History and Physical Interval Note:  09/29/2017 7:25 AM  Brandy GibbonsJennifer N Stallsmith  has presented today for surgery, with the diagnosis of 2cm sebaceous cyst  The various methods of treatment have been discussed with the patient and family. After consideration of risks, benefits and other options for treatment, the patient has consented to  Procedure(s): EXCISION SEBACEOUS 2CM CYST ON BACK (N/A) as a surgical intervention .  The patient's history has been reviewed, patient examined, no change in status, stable for surgery.  I have reviewed the patient's chart and labs.  Questions were answered to the patient's satisfaction.    No questions. Patient marked.   Lucretia RoersLindsay C Bridges

## 2017-09-29 NOTE — Anesthesia Postprocedure Evaluation (Signed)
Anesthesia Post Note  Patient: Brandy Jefferson  Procedure(s) Performed: EXCISION SEBACEOUS 2CM CYST ON BACK (N/A )  Patient location during evaluation: Short Stay Anesthesia Type: General and MAC Level of consciousness: awake and alert, oriented and patient cooperative Pain management: pain level controlled Vital Signs Assessment: post-procedure vital signs reviewed and stable Cardiovascular status: stable Postop Assessment: no apparent nausea or vomiting Anesthetic complications: no     Last Vitals:  Vitals:   09/29/17 0852 09/29/17 0907  BP:  (!) 117/58  Pulse: 70 68  Resp: 17 16  Temp:  (!) 36.4 C  SpO2: 97% 96%    Last Pain:  Vitals:   09/29/17 0907  TempSrc: Oral  PainSc: 0-No pain                 ADAMS, AMY A

## 2017-09-29 NOTE — Transfer of Care (Signed)
Immediate Anesthesia Transfer of Care Note  Patient: Brandy Jefferson  Procedure(s) Performed: EXCISION SEBACEOUS 2CM CYST ON BACK (N/A )  Patient Location: PACU  Anesthesia Type:General  Level of Consciousness: awake, alert  and oriented  Airway & Oxygen Therapy: Patient Spontanous Breathing and Patient connected to nasal cannula oxygen  Post-op Assessment: Report given to RN and Post -op Vital signs reviewed and stable  Post vital signs: Reviewed and stable  Last Vitals:  Vitals:   09/29/17 0720 09/29/17 0725  BP: (!) 120/53 (!) 120/53  Pulse:    Resp: 19 (!) 25  Temp:    SpO2: 95% 98%    Last Pain:  Vitals:   09/29/17 0649  TempSrc: Oral      Patients Stated Pain Goal: 8 (09/29/17 0649)  Complications: No apparent anesthesia complications

## 2017-09-29 NOTE — Op Note (Signed)
[facility] Operative Note  09/29/17  Preoperative Diagnosis:  Sebaceous cyst    Postoperative Diagnosis: Same   Procedure(s) Performed:  Excision of 2cm sebaceous cyst    Surgeon: Lillia AbedLindsay C. Henreitta LeberBridges, MD   Assistants: No qualified resident was available   Anesthesia: General endotracheal   Anesthesiologist: Jayme CloudGonzalez, MD   Specimens: Cyst    Estimated Blood Loss: Minimal   Blood Replacement: None    Complications: None   Wound Class: Clean    Operative Indications: [name] is a 42 yo who has recently had issues with her cyst on her back becoming infected. She was treated with antibiotics by her PCP, and the swelling and redness improved and she was sent to me to discuss removal.  She was doing better when I saw her but still continued to have some redness of the skin and some induration in the area.  I gave her 5 days of antibiotics leading up to the surgery in hopes that this would not get reinfected prior to surgery.  After a discussion of the risk and benefits of surgery including but not limited to bleeding, infection, and risk of recurrence, she opted to proceed.   Findings: Ruptured cyst without signs of infection    Procedure: The patient was taken to the operating room and placed supine. General endotracheal anesthesia was induced.  She was then transferred to her right lateral decubitus position on a bean bag and all pressure points were padded. Intravenous antibiotics were administered per protocol. The left upper back was prepared and draped in the usual sterile fashion.   A Horizontal incision was made over the area of the cyst in her natural lines of tension on her back.  The cyst wall was evident at the very superficial edge of the epidermis.  An ellipse of skin was excised and the capsule of the cyst was removed using a combination of blunt dissection and dissection with cautery.  The area was cleaned out and local was injected.  The deep layer  was closed with 3-0 vicryl interrupted sutures, and given the need for the ellipse of skin to be removed, the wound was under some tension, so the skin was closed with interrupted vertical mattress 3-0 Prolene. The specimen was sent to pathology.    Final inspection revealed acceptable hemostasis. All counts were correct at the end of the case. The patient was awakened from anesthesia and extubate without complication.  The patient went to the PACU in stable condition.   Algis GreenhouseLindsay Karisa Nesser, MD [facility] [address] [place], [place] [phone] (office)

## 2017-09-29 NOTE — Discharge Instructions (Signed)
Discharge Instructions: Remove dressing tomorrow. Shower per your regular routine. Take tylenol and ibuprofen as needed for pain control, alternating every 4-6 hours.  Take Roxicodone for breakthrough pain. Take colace for constipation related to narcotic pain medication. Sutures will be removed in the office. Expect some drainage from the wound. Dry dressing as needed.   Excision of Skin Lesions, Care After Refer to this sheet in the next few weeks. These instructions provide you with information about caring for yourself after your procedure. Your health care provider may also give you more specific instructions. Your treatment has been planned according to current medical practices, but problems sometimes occur. Call your health care provider if you have any problems or questions after your procedure. What can I expect after the procedure? After your procedure, it is common to have pain or discomfort at the excision site. Follow these instructions at home:  Take over-the-counter and prescription medicines only as told by your health care provider.  Follow instructions from your health care provider about: ? How to take care of your excision site. You should keep the site clean, dry, and protected for at least 48 hours. ? When and how you should change your bandage (dressing). ? When you should remove your dressing. ? Removing whatever was used to close your excision site.  Check the excision area every day for signs of infection. Watch for: ? Redness, swelling, or pain. ? Fluid, blood, or pus.  For bleeding, apply gentle but firm pressure to the area using a folded towel for 20 minutes.  Avoid high-impact exercise and activities until the stitches (sutures) are removed or the area heals.  Follow instructions from your health care provider about how to minimize scarring. Avoid sun exposure until the area has healed. Scarring should lessen over time.  Keep all follow-up visits as told  by your health care provider. This is important. Contact a health care provider if:  You have a fever.  You have redness, swelling, or pain at the excision site.  You have fluid, blood, or pus coming from the excision site.  You have ongoing bleeding at the excision site.  You have pain that does not improve in 2-3 days after your procedure.  You notice skin irregularities or changes in sensation. This information is not intended to replace advice given to you by your health care provider. Make sure you discuss any questions you have with your health care provider. Document Released: 11/13/2014 Document Revised: 12/05/2015 Document Reviewed: 08/15/2014 Elsevier Interactive Patient Education  [employer]2018 Elsevier Inc.

## 2017-09-30 ENCOUNTER — Encounter (HOSPITAL_COMMUNITY): Payer: Self-pay | Admitting: General Surgery

## 2017-09-30 NOTE — Addendum Note (Signed)
Addendum  created 09/30/17 0708 by Franco NonesYates, Anayi Bricco S, CRNA   Charge Capture section accepted

## 2017-10-05 ENCOUNTER — Telehealth: Payer: Self-pay | Admitting: General Surgery

## 2017-10-05 NOTE — Telephone Encounter (Signed)
[facility]  Patient sent me email with photos of wound. Called back last night. Photos looked bruised with some fibrinous drainage, did not look infected. Left message last night and called her this AM. She has no redness, does have some drainage from center of wound, reports as pus, but from the photo looks more fibrinous.  Told her to watch for redness extending out. Can put neosporin on the incision.  Algis GreenhouseLindsay Marco Adelson, MD [facility] [address] [place], [place] [id number] [phone] (office)

## 2017-10-07 ENCOUNTER — Other Ambulatory Visit: Payer: Self-pay | Admitting: Neurology

## 2017-10-07 NOTE — Telephone Encounter (Signed)
Called, LVM (ok per DPR) advising we received refill request for levetiracetam but she has not been seen since 2017. She needs to follow up at least once a year to continue receiving refills on her medication.  Asked her to call back to get this scheduled and then we can provide a one time fill until she is seen in the office.   If she calls, can offer 10/21/17 at 12pm, check in 11:30am with Dr. Anne HahnWillis

## 2017-10-11 NOTE — Telephone Encounter (Signed)
Called pt again, LVM on mobile. Tried work number. They stated pt "on the floor" and can only take calls if its an emergency".   I called mother Synetta Fail[place]. Explained pt has not been seen since 2017. Needs to be seen once yearly for refills. She verified patient knew she had to be seen to get refills. I offered several upcoming work in slots. She stated pt has 10/28/17 off work. I scheduled appt for 10/28/17 at 12pm, check in 1130am. Advised mother to have her call back if appt does not work. Advised we will send one more refill until she is seen in the office. She verbalized understanding and appreciation for call.

## 2017-10-12 ENCOUNTER — Ambulatory Visit (INDEPENDENT_AMBULATORY_CARE_PROVIDER_SITE_OTHER): Payer: Self-pay | Admitting: General Surgery

## 2017-10-12 ENCOUNTER — Encounter: Payer: Self-pay | Admitting: General Surgery

## 2017-10-12 VITALS — BP 141/80 | HR 72 | Temp 97.5°F | Resp 18 | Ht 65.0 in | Wt 260.0 lb

## 2017-10-12 DIAGNOSIS — L723 Sebaceous cyst: Secondary | ICD-10-CM

## 2017-10-12 NOTE — Patient Instructions (Addendum)
Steristrips will fall off in a few days.  Pack middle portion of wound with dry to dampened gauze daily.  You can buy saline from the pharmacy (0.9 % normal saline). Or you can make it.   Saline Solution Recipe Saline, also called saline solution, is sterile salt water. It can have different amounts of salt in it. "Normal" saline has about 0.9% salt. You can make it or buy it.   Making saline solution   Get a clean storage container and mixing utensil. Either wash them in the dishwasher or boil them for 5 minutes.   Use 1 quart (4 cups) of distilled water, or boil 1 quart of tap water for 5 minutes. Do not use well water or sea water.   Add 2 teaspoons of table salt.   Mix the water and salt well until the salt is completely dissolved.   Cool to room temperature before using.   Saline solution can be stored at room temperature in a tightly covered glass or plastic bottle. You can keep it for up to one week. Always label it and include the date.

## 2017-10-12 NOTE — Progress Notes (Signed)
[facility] Note   HPI:  42 y.o. Female presents to clinic for post-op follow-up evaluation after excision of sebaceous cyst from her back. She did call one and was worried that it was infected, but the photo she sent had no signs of infection. She has had some drainage and the mid portion did dehisce slightly.   Review of Systems:  No fever or chills No erythema + drainage  All other review of systems: otherwise negative   Pathology: Diagnosis Cyst, excision, left upper back - FINDINGS CONSISTENT WITH RUPTURED EPIDERMOID INCLUSION CYST. - THERE IS NO EVIDENCE OF MALIGNANCY.  Vital Signs:  BP (!) 141/80   Pulse 72   Temp (!) 97.5 F (36.4 C)   Resp 18   Ht 5\' 5"  (1.651 m)   Wt 260 lb (117.9 kg)   BMI 43.27 kg/m    Physical Exam:  Physical Exam  Constitutional: She is well-developed, well-nourished, and in no distress.  HENT:  Head: Normocephalic.  Cardiovascular: Normal rate.  Pulmonary/Chest: Effort normal.  Musculoskeletal:  Back incision with sutures removed, steristrip on edges, mid portion dehisced, packing placed, probed and tracked about 1cm cavity, minimal drainage  Vitals reviewed.   Laboratory studies: None   Imaging:  None    Assessment:  42 y.o. yo Female s/p excision of a sebaceous cyst. Doing fair but did have partial wound dehisce. No signs of infection.   Plan:  - Pack daily with dry to damp saline  - Shower per normal routine  - Follow up in 2 weeks, if healed and doing well can cancel    All of the above recommendations were discussed with the patient, and all of patient's were answered to her expressed satisfaction.  Algis GreenhouseLindsay Anwar Crill, MD [facility] [address] [place], [place] [id number] [phone] (office)

## 2017-10-18 ENCOUNTER — Other Ambulatory Visit: Payer: Self-pay | Admitting: Physician Assistant

## 2017-10-18 DIAGNOSIS — R601 Generalized edema: Secondary | ICD-10-CM

## 2017-10-28 ENCOUNTER — Ambulatory Visit: Payer: BLUE CROSS/BLUE SHIELD | Admitting: Neurology

## 2017-10-28 ENCOUNTER — Ambulatory Visit: Payer: BLUE CROSS/BLUE SHIELD | Admitting: General Surgery

## 2017-10-28 ENCOUNTER — Encounter: Payer: Self-pay | Admitting: General Surgery

## 2017-10-28 ENCOUNTER — Encounter: Payer: Self-pay | Admitting: Neurology

## 2017-10-28 ENCOUNTER — Ambulatory Visit (INDEPENDENT_AMBULATORY_CARE_PROVIDER_SITE_OTHER): Payer: Self-pay | Admitting: General Surgery

## 2017-10-28 ENCOUNTER — Other Ambulatory Visit: Payer: Self-pay

## 2017-10-28 VITALS — BP 148/81 | HR 78 | Ht 65.0 in | Wt 258.0 lb

## 2017-10-28 VITALS — BP 154/78 | HR 85 | Temp 98.4°F | Ht 65.0 in | Wt 258.0 lb

## 2017-10-28 DIAGNOSIS — R569 Unspecified convulsions: Secondary | ICD-10-CM | POA: Diagnosis not present

## 2017-10-28 DIAGNOSIS — Z09 Encounter for follow-up examination after completed treatment for conditions other than malignant neoplasm: Secondary | ICD-10-CM

## 2017-10-28 MED ORDER — LEVETIRACETAM 500 MG PO TABS: ORAL_TABLET | ORAL | 3 refills | Status: DC

## 2017-10-28 NOTE — Progress Notes (Signed)
Subjective:     Marvetta GibbonsJennifer N Manthey  Here for follow-up wound check.  Status post excision of sebaceous cyst on her back over the left scapula.  Patient denies any drainage from the wound.  She has no pain at the wound site. Objective:    BP (!) 154/78   Pulse 85   Temp 98.4 F (36.9 C)   Ht 5\' 5"  (1.651 m)   Wt 258 lb (117 kg)   BMI 42.93 kg/m   General:  alert, cooperative and no distress  Left upper back wound healing well by secondary intention.  Is approximately 1.5 cm in its greatest diameter.  Granulation tissue was present at the base.  No purulent drainage noted.  Triple antibiotic ointment applied.     Assessment:    Superficial wound dehiscence, healing well by secondary intention    Plan:  Keep wound clean and dry with soap and water twice a day then apply triple antibiotic ointment.  Follow-up with Dr. Henreitta LeberBridges in 3 weeks.

## 2017-10-28 NOTE — Progress Notes (Signed)
Reason for visit: Seizures  Brandy Jefferson is an 42 y.o. 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  Brandy Jefferson tolerates Brandy medication well without drowsiness or irritability.  She reports no seizures since last seen about 2 years ago.  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  She recently had a cyst removed from her left shoulder but otherwise no new medical problems have been noted.  She returns to this office for an evaluation.  Past Medical History:  Diagnosis Date  . Arteriovenous malformation of brain    Left temporal  . Partial seizure (HCC) 12/07/2012  . Plantar fasciitis    both feet  . Seizures (HCC)    Partial seizures, right hemisensory    Past Surgical History:  Procedure Laterality Date  . Arteriovenous malformation ablation  2004   Left temporal, intravascular procedure  . MASS EXCISION N/A 09/29/2017   Procedure: EXCISION SEBACEOUS 2CM CYST ON BACK;  Surgeon: Lucretia RoersBridges, Lindsay C, MD;  Location: [facility];  Service: General;  Laterality: N/A;    Family History  Problem Relation Age of Onset  . Cancer Father     Social history:  reports that she has never smoked. She has never used smokeless tobacco. She reports that she does not drink alcohol or use drugs.   No Known Allergies  Medications:  Prior to Admission medications   Medication Sig Start Date End Date Taking? Authorizing Provider  cyclobenzaprine (FLEXERIL) 10 MG tablet Take 1 tablet (10 mg total) by mouth 3 (three) times daily as needed for muscle spasms. 04/27/17  Yes Remus LofflerJones, Angel S, PA-C  diclofenac (VOLTAREN) 75 MG EC tablet Take 75 mg by mouth twice daily 08/13/16  Yes [provider]  hydrochlorothiazide (HYDRODIURIL)  25 MG tablet TAKE 1 TABLET BY MOUTH EVERY DAY 10/18/17  Yes Remus LofflerJones, Angel S, PA-C  levETIRAcetam (KEPPRA) 500 MG tablet TAKE ONE TABLET IN Brandy MORNING AND TWO AT NIGHT. Must f/u to receive future refills 10/28/17  Yes York SpanielWillis, Winona Sison K, MD  norgestimate-ethinyl estradiol (ORTHO-CYCLEN,SPRINTEC,PREVIFEM) 0.25-35 MG-MCG tablet Take 1 tablet by mouth daily.   Yes [provider]  oxyCODONE (ROXICODONE) 5 MG immediate release tablet Take 1 tablet (5 mg total) by mouth every 8 (eight) hours as needed. 09/29/17 09/29/18 Yes Lucretia RoersBridges, Lindsay C, MD    ROS:  Out of a complete 14 system review of symptoms, Brandy Jefferson complains only of Brandy following symptoms, and all other reviewed systems are negative.  Foot pain  Blood pressure (!) 148/81, pulse 78, height 5\' 5"  (1.651 m), weight 258 lb (117 kg), SpO2 98 %.  Physical Exam  General: Brandy Jefferson is alert and cooperative at Brandy time of Brandy examination.  Brandy Jefferson is markedly obese.  Skin: No significant peripheral edema is noted.   Neurologic Exam  Mental status: Brandy Jefferson is alert and oriented x 3 at Brandy time of Brandy examination. Brandy Jefferson has apparent normal recent and remote memory, with an apparently normal attention span and concentration ability.   Cranial nerves: Facial symmetry is present. Speech is normal, no aphasia or dysarthria is noted. Extraocular movements are full. Visual fields are full.  Motor: Brandy Jefferson has good strength in all 4  extremities.  Sensory examination: Soft touch sensation is symmetric on Brandy face, arms, and legs.  Coordination: Brandy Jefferson has good finger-nose-finger and heel-to-shin bilaterally.  Gait and station: Brandy Jefferson has a normal gait. Tandem gait is normal. Romberg is negative. No drift is seen.  Reflexes: Deep tendon reflexes are symmetric.   Assessment/Plan:  1.  History of seizures, well controlled  Brandy Jefferson will remain on Keppra, a prescription was sent in for her.  He will  contact her office if any new events are noted.  Follow-up otherwise in 1 year.  Marlan Palau MD 10/28/2017 12:09 PM  [facility] [address] [place], [place] [address]  Phone [phone] Fax [phone]

## 2017-11-16 ENCOUNTER — Ambulatory Visit (INDEPENDENT_AMBULATORY_CARE_PROVIDER_SITE_OTHER): Payer: Self-pay | Admitting: General Surgery

## 2017-11-16 ENCOUNTER — Encounter: Payer: Self-pay | Admitting: General Surgery

## 2017-11-16 VITALS — BP 126/58 | HR 66 | Temp 99.2°F | Resp 16 | Ht 65.0 in | Wt 260.0 lb

## 2017-11-16 DIAGNOSIS — L723 Sebaceous cyst: Secondary | ICD-10-CM

## 2017-11-16 DIAGNOSIS — Z09 Encounter for follow-up examination after completed treatment for conditions other than malignant neoplasm: Secondary | ICD-10-CM

## 2017-11-16 NOTE — Progress Notes (Signed)
[facility] Note   HPI:  42 y.o. Female presents to clinic for follow-up evaluation of her left back sebaceous cyst wound that dehisced. She is doing well. No major issues reported. Healing but having some itching.   Review of Systems:  No fevers or chills + itching  All other review of systems: otherwise negative   Vital Signs:  BP (!) 126/58 (BP Location: Left Arm, Patient Position: Sitting, Cuff Size: Normal)   Pulse 66   Temp 99.2 F (37.3 C) (Oral)   Resp 16   Ht  (1.651 m)   Wt 260 lb (117.9 kg)   BMI 43.27 kg/m    Physical Exam:  Physical Exam  Constitutional: She appears well-developed and well-nourished.  Cardiovascular: Normal rate.  Pulmonary/Chest: Effort normal.  Left back with scab, some new purple skin, but otherwise no signs of infection    Laboratory studies: None   Imaging:  None    Assessment:  42 y.o. yo Female with healed dehiscence of her left back wound s/p excision of a sebaceous cyst. Doing well.   Plan:  - Creams/ ointment as needed to area   - Follow up PRN   All of the above recommendations were discussed with the patient and patient's family, and all of patient's and family's questions were answered to their expressed satisfaction.  Algis Greenhouse, MD [facility] [address] [place], [place] [address] [phone] (office)

## 2018-01-10 ENCOUNTER — Other Ambulatory Visit: Payer: Self-pay | Admitting: Physician Assistant

## 2018-01-10 DIAGNOSIS — R601 Generalized edema: Secondary | ICD-10-CM

## 2018-04-14 ENCOUNTER — Other Ambulatory Visit: Payer: Self-pay | Admitting: Physician Assistant

## 2018-04-14 DIAGNOSIS — R601 Generalized edema: Secondary | ICD-10-CM

## 2018-06-22 ENCOUNTER — Ambulatory Visit: Payer: BLUE CROSS/BLUE SHIELD | Admitting: Physician Assistant

## 2018-06-22 ENCOUNTER — Encounter: Payer: Self-pay | Admitting: Physician Assistant

## 2018-06-22 DIAGNOSIS — M25511 Pain in right shoulder: Secondary | ICD-10-CM | POA: Diagnosis not present

## 2018-06-22 DIAGNOSIS — R601 Generalized edema: Secondary | ICD-10-CM | POA: Diagnosis not present

## 2018-06-22 DIAGNOSIS — F320 Major depressive disorder, single episode, mild: Secondary | ICD-10-CM

## 2018-06-22 DIAGNOSIS — G8929 Other chronic pain: Secondary | ICD-10-CM

## 2018-06-22 MED ORDER — CYCLOBENZAPRINE HCL 10 MG PO TABS: 10.0000 mg | ORAL_TABLET | Freq: Three times a day (TID) | ORAL | 0 refills | Status: DC | PRN

## 2018-06-22 MED ORDER — HYDROCHLOROTHIAZIDE 25 MG PO TABS: 25.0000 mg | ORAL_TABLET | Freq: Every day | ORAL | 3 refills | Status: DC

## 2018-06-22 MED ORDER — PHENTERMINE HCL 37.5 MG PO TABS: 37.5000 mg | ORAL_TABLET | Freq: Every day | ORAL | 1 refills | Status: DC

## 2018-06-22 MED ORDER — FLUOXETINE HCL 10 MG PO CAPS: 10.0000 mg | ORAL_CAPSULE | Freq: Every day | ORAL | 3 refills | Status: DC

## 2018-06-24 NOTE — Progress Notes (Signed)
BP 108/62   Pulse 74   Temp 97.6 F (36.4 C)   Ht 5\' 5"  (1.651 m)   Wt 268 lb (121.6 kg)   BMI 44.60 kg/m    Subjective:    Patient ID: Brandy Jefferson, female    DOB: [birth year], 42 y.o.   MRN: [id number]  HPI: Brandy Jefferson is a 42 y.o. female presenting on 06/22/2018 for Follow-up (medications)  This patient comes in for recheck on her chronic medical conditions.  They do include chronic right shoulder plain related to tendinitis and overuse through her job.  She has been to orthopedics with some relief.  She also would like to work on losing weight.  She has had more problems with weight gain since being more and active related to her joint pain.  She also has edema that occurs in her lower legs.  She has used hydrochlorothiazide in the past with good success.  She was currently not on any.  She also is having a little bit more depression.  [facility] 06/22/2018 08/27/2017 07/28/2017 04/27/2017 11/04/2016  Decreased Interest 2 0 - 0 0  Down, Depressed, Hopeless 2 0 0 0 0  PHQ - 2 Score 4 0 0 0 0  Altered sleeping 2 - - - -  Tired, decreased energy 2 - - - -  Change in appetite 2 - - - -  Feeling bad or failure about yourself  2 - - - -  Trouble concentrating 1 - - - -  Moving slowly or fidgety/restless 1 - - - -  Suicidal thoughts 1 - - - -     Past Medical History:  Diagnosis Date  . Arteriovenous malformation of brain    Left temporal  . Partial seizure (HCC) 12/07/2012  . Plantar fasciitis    both feet  . Seizures (HCC)    Partial seizures, right hemisensory   Relevant past medical, surgical, family and social history reviewed and updated as indicated. Interim medical history since our last visit reviewed. Allergies and medications reviewed and updated. DATA REVIEWED: CHART IN EPIC  Family History reviewed for pertinent findings.  Review of Systems  Constitutional: Negative.   HENT: Negative.   Eyes: Negative.   Respiratory: Negative.     Gastrointestinal: Negative.   Genitourinary: Negative.   Musculoskeletal: Positive for arthralgias, back pain, joint swelling and myalgias.    Allergies as of 06/22/2018   No Known Allergies     Medication List       Accurate as of June 22, 2018 11:59 PM. Always use your most recent med list.        cyclobenzaprine 10 MG tablet Commonly known as:  FLEXERIL Take 1 tablet (10 mg total) by mouth 3 (three) times daily as needed for muscle spasms.   diclofenac 75 MG EC tablet Commonly known as:  VOLTAREN Take 75 mg by mouth twice daily   FLUoxetine 10 MG capsule Commonly known as:  PROZAC Take 1 capsule (10 mg total) by mouth daily.   hydrochlorothiazide 25 MG tablet Commonly known as:  HYDRODIURIL Take 1 tablet (25 mg total) by mouth daily. (Needs to be seen before next refill)   levETIRAcetam 500 MG tablet Commonly known as:  KEPPRA TAKE ONE TABLET IN THE MORNING AND TWO AT NIGHT. Must f/u to receive future refills   norgestimate-ethinyl estradiol 0.25-35 MG-MCG tablet Commonly known as:  ORTHO-CYCLEN,SPRINTEC,PREVIFEM Take 1 tablet by mouth daily.   phentermine 37.5 MG tablet Commonly  known as:  ADIPEX-P Take 1 tablet (37.5 mg total) by mouth daily before breakfast.          Objective:    BP 108/62   Pulse 74   Temp 97.6 F (36.4 C)   Ht 5\' 5"  (1.651 m)   Wt 268 lb (121.6 kg)   BMI 44.60 kg/m   No Known Allergies  Wt Readings from Last 3 Encounters:  06/22/18 268 lb (121.6 kg)  11/16/17 260 lb (117.9 kg)  10/28/17 258 lb (117 kg)    Physical Exam Constitutional:      Appearance: She is well-developed.  HENT:     Head: Normocephalic and atraumatic.  Eyes:     Conjunctiva/sclera: Conjunctivae normal.     Pupils: Pupils are equal, round, and reactive to light.  Cardiovascular:     Rate and Rhythm: Normal rate and regular rhythm.     Heart sounds: Normal heart sounds.  Pulmonary:     Effort: Pulmonary effort is normal.     Breath sounds:  Normal breath sounds.  Abdominal:     General: Bowel sounds are normal.     Palpations: Abdomen is soft.  Musculoskeletal:     Right shoulder: She exhibits decreased range of motion and tenderness.  Skin:    General: Skin is warm and dry.     Findings: No rash.  Neurological:     Mental Status: She is alert and oriented to person, place, and time.     Deep Tendon Reflexes: Reflexes are normal and symmetric.  Psychiatric:        Behavior: Behavior normal.        Thought Content: Thought content normal.        Judgment: Judgment normal.         Assessment & Plan:   1. Generalized edema - hydrochlorothiazide (HYDRODIURIL) 25 MG tablet; Take 1 tablet (25 mg total) by mouth daily. (Needs to be seen before next refill)  Dispense: 90 tablet; Refill: 3  2. Chronic right shoulder pain - cyclobenzaprine (FLEXERIL) 10 MG tablet; Take 1 tablet (10 mg total) by mouth 3 (three) times daily as needed for muscle spasms.  Dispense: 60 tablet; Refill: 0  3. Morbid obesity (HCC) - phentermine (ADIPEX-P) 37.5 MG tablet; Take 1 tablet (37.5 mg total) by mouth daily before breakfast.  Dispense: 30 tablet; Refill: 1  4. Depression, major, single episode, mild (HCC) - FLUoxetine (PROZAC) 10 MG capsule; Take 1 capsule (10 mg total) by mouth daily.  Dispense: 30 capsule; Refill: 3   Continue all other maintenance medications as listed above.  Follow up plan: Return in about 4 weeks (around 07/20/2018).  Educational handout given for survey  Remus Loffler PA-C [facility] [address]  [place], [place] [address] [phone]   06/24/2018, 5:09 PM

## 2018-06-27 DIAGNOSIS — Z01419 Encounter for gynecological examination (general) (routine) without abnormal findings: Secondary | ICD-10-CM | POA: Diagnosis not present

## 2018-06-27 DIAGNOSIS — Z6841 Body Mass Index (BMI) 40.0 and over, adult: Secondary | ICD-10-CM | POA: Diagnosis not present

## 2018-07-14 ENCOUNTER — Other Ambulatory Visit: Payer: Self-pay | Admitting: Physician Assistant

## 2018-07-14 DIAGNOSIS — F320 Major depressive disorder, single episode, mild: Secondary | ICD-10-CM

## 2018-07-29 ENCOUNTER — Encounter: Payer: Self-pay | Admitting: Physician Assistant

## 2018-07-29 ENCOUNTER — Ambulatory Visit: Payer: BLUE CROSS/BLUE SHIELD | Admitting: Physician Assistant

## 2018-07-29 DIAGNOSIS — F320 Major depressive disorder, single episode, mild: Secondary | ICD-10-CM

## 2018-07-29 MED ORDER — FLUOXETINE HCL 10 MG PO CAPS: ORAL_CAPSULE | ORAL | 3 refills | Status: DC

## 2018-07-29 MED ORDER — PHENTERMINE HCL 37.5 MG PO TABS: 37.5000 mg | ORAL_TABLET | Freq: Every day | ORAL | 1 refills | Status: DC

## 2018-08-01 NOTE — Progress Notes (Signed)
BP 115/76   Pulse 69   Ht 5\' 5"  (1.651 m)   Wt 250 lb 12.8 oz (113.8 kg)   BMI 41.74 kg/m    Subjective:    Patient ID: Brandy Jefferson, female    DOB: 11-11-1975, 43 y.o.   MRN: [id number]  HPI: Brandy Jefferson is a 43 y.o. female presenting on 07/29/2018 for Weight Check (1 month follow up) and Depression  This patient comes in for.  Recheck on her chronic conditions.  She has been working very diligently on weight loss efforts.  She has lost 18 pounds since her last visit here.   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  She has a hard time doing exercise outside of that.  She does have known Degenerative joint disease of the knees.  She has made big changes with her diet.  She does feel that she can continue this.    Past Medical History:  Diagnosis Date  . Arteriovenous malformation of brain    Left temporal  . Partial seizure (HCC) 12/07/2012  . Plantar fasciitis    both feet  . Seizures (HCC)    Partial seizures, right hemisensory   Relevant past medical, surgical, family and social history reviewed and updated as indicated. Interim medical history since our last visit reviewed. Allergies and medications reviewed and updated. DATA REVIEWED: CHART IN EPIC  Family History reviewed for pertinent findings.  Review of Systems  Constitutional: Negative.  Negative for activity change, fatigue and fever.  HENT: Negative.   Eyes: Negative.   Respiratory: Negative.  Negative for cough.   Cardiovascular: Negative.  Negative for chest pain.  Gastrointestinal: Negative.  Negative for abdominal pain.  Endocrine: Negative.   Genitourinary: Negative.  Negative for dysuria.  Musculoskeletal: Negative.   Skin: Negative.   Neurological: Negative.     Allergies as of 07/29/2018   No Known Allergies     Medication List       Accurate as of July 29, 2018 11:59 PM. Always use your most recent med list.        cyclobenzaprine 10 MG  tablet Commonly known as:  FLEXERIL Take 1 tablet (10 mg total) by mouth 3 (three) times daily as needed for muscle spasms.   diclofenac 75 MG EC tablet Commonly known as:  VOLTAREN Take 75 mg by mouth twice daily   FLUoxetine 10 MG capsule Commonly known as:  PROZAC TAKE 1 CAPSULE BY MOUTH EVERY DAY   hydrochlorothiazide 25 MG tablet Commonly known as:  HYDRODIURIL Take 1 tablet (25 mg total) by mouth daily. (Needs to be seen before next refill)   levETIRAcetam 500 MG tablet Commonly known as:  KEPPRA TAKE ONE TABLET IN THE MORNING AND TWO AT NIGHT. Must f/u to receive future refills   norgestimate-ethinyl estradiol 0.25-35 MG-MCG tablet Commonly known as:  ORTHO-CYCLEN,SPRINTEC,PREVIFEM Take 1 tablet by mouth daily.   phentermine 37.5 MG tablet Commonly known as:  ADIPEX-P Take 1 tablet (37.5 mg total) by mouth daily before breakfast.          Objective:    BP 115/76   Pulse 69   Ht 5\' 5"  (1.651 m)   Wt 250 lb 12.8 oz (113.8 kg)   BMI 41.74 kg/m   No Known Allergies  Wt Readings from Last 3 Encounters:  07/29/18 250 lb 12.8 oz (113.8 kg)  06/22/18 268 lb (121.6 kg)  11/16/17 260 lb (117.9 kg)  Physical Exam Constitutional:      Appearance: She is well-developed.  HENT:     Head: Normocephalic and atraumatic.  Eyes:     Conjunctiva/sclera: Conjunctivae normal.     Pupils: Pupils are equal, round, and reactive to light.  Cardiovascular:     Rate and Rhythm: Normal rate and regular rhythm.     Heart sounds: Normal heart sounds.  Pulmonary:     Effort: Pulmonary effort is normal.     Breath sounds: Normal breath sounds.  Abdominal:     General: Bowel sounds are normal.     Palpations: Abdomen is soft.  Skin:    General: Skin is warm and dry.     Findings: No rash.  Neurological:     Mental Status: She is alert and oriented to person, place, and time.     Deep Tendon Reflexes: Reflexes are normal and symmetric.  Psychiatric:        Behavior:  Behavior normal.        Thought Content: Thought content normal.        Judgment: Judgment normal.     Results for orders placed or performed during the hospital encounter of 09/21/17  Surgical pcr screen  Result Value Ref Range   MRSA, PCR NEGATIVE NEGATIVE   Staphylococcus aureus NEGATIVE NEGATIVE  CBC with Differential/Platelet  Result Value Ref Range   WBC 7.1 4.0 - 10.5 K/uL   RBC 4.55 3.87 - 5.11 MIL/uL   Hemoglobin 13.0 12.0 - 15.0 g/dL   HCT 16.141.7 09.636.0 - 04.546.0 %   MCV 91.6 78.0 - 100.0 fL   MCH 28.6 26.0 - 34.0 pg   MCHC 31.2 30.0 - 36.0 g/dL   RDW 40.913.7 81.111.5 - 91.415.5 %   Platelets 370 150 - 400 K/uL   Neutrophils Relative % 64 %   Neutro Abs 4.5 1.7 - 7.7 K/uL   Lymphocytes Relative 24 %   Lymphs Abs 1.7 0.7 - 4.0 K/uL   Monocytes Relative 8 %   Monocytes Absolute 0.6 0.1 - 1.0 K/uL   Eosinophils Relative 3 %   Eosinophils Absolute 0.2 0.0 - 0.7 K/uL   Basophils Relative 1 %   Basophils Absolute 0.1 0.0 - 0.1 K/uL  Basic metabolic panel  Result Value Ref Range   Sodium 139 135 - 145 mmol/L   Potassium 3.7 3.5 - 5.1 mmol/L   Chloride 107 101 - 111 mmol/L   CO2 23 22 - 32 mmol/L   Glucose, Bld 96 65 - 99 mg/dL   BUN 15 6 - 20 mg/dL   Creatinine, Ser 7.820.65 0.44 - 1.00 mg/dL   Calcium 8.5 (L) 8.9 - 10.3 mg/dL   GFR calc non Af Amer >60 >60 mL/min   GFR calc Af Amer >60 >60 mL/min   Anion gap 9 5 - 15  hCG, serum, qualitative  Result Value Ref Range   Preg, Serum NEGATIVE NEGATIVE      Assessment & Plan:   1. Morbid obesity (HCC) - phentermine (ADIPEX-P) 37.5 MG tablet; Take 1 tablet (37.5 mg total) by mouth daily before breakfast.  Dispense: 30 tablet; Refill: 1  2. Depression, major, single episode, mild (HCC) - FLUoxetine (PROZAC) 10 MG capsule; TAKE 1 CAPSULE BY MOUTH EVERY DAY  Dispense: 90 capsule; Refill: 3   Continue all other maintenance medications as listed above.  Follow up plan: Return in about 6 weeks (around 09/09/2018).  Educational handout  given for survey  Remus LofflerAngel S. Neilani Duffee PA-C [facility] [address]  [place], [place] [address] [phone]   08/01/2018, 1:48 PM

## 2018-09-23 ENCOUNTER — Other Ambulatory Visit: Payer: Self-pay

## 2018-09-23 ENCOUNTER — Encounter: Payer: Self-pay | Admitting: Physician Assistant

## 2018-09-23 ENCOUNTER — Ambulatory Visit: Payer: BLUE CROSS/BLUE SHIELD | Admitting: Physician Assistant

## 2018-09-23 MED ORDER — PHENTERMINE HCL 37.5 MG PO TABS: 37.5000 mg | ORAL_TABLET | Freq: Every day | ORAL | 1 refills | Status: DC

## 2018-09-23 NOTE — Progress Notes (Signed)
BP 103/66   Pulse 72   Temp 97.8 F (36.6 C) (Oral)   Ht 5\' 5"  (1.651 m)   Wt 244 lb 12.8 oz (111 kg)   BMI 40.74 kg/m    Subjective:    Patient ID: Brandy Jefferson, female    DOB: [birth year], 43 y.o.   MRN: [id number]  HPI: Brandy Jefferson is a 43 y.o. female presenting on 09/23/2018 for Weight Check (6 week rck )  Recheck on her diet and weight efforts.  She is down 24 pounds since last fall.  And 6 since her last visit.  She is doing a very good job about portion control and food choices.  Past Medical History:  Diagnosis Date  . Arteriovenous malformation of brain    Left temporal  . Partial seizure (HCC) 12/07/2012  . Plantar fasciitis    both feet  . Seizures (HCC)    Partial seizures, right hemisensory   Relevant past medical, surgical, family and social history reviewed and updated as indicated. Interim medical history since our last visit reviewed. Allergies and medications reviewed and updated. DATA REVIEWED: CHART IN EPIC  Family History reviewed for pertinent findings.  Review of Systems  Allergies as of 09/23/2018   No Known Allergies     Medication List       Accurate as of September 23, 2018 11:59 PM. Always use your most recent med list.        cyclobenzaprine 10 MG tablet Commonly known as:  FLEXERIL Take 1 tablet (10 mg total) by mouth 3 (three) times daily as needed for muscle spasms.   diclofenac 75 MG EC tablet Commonly known as:  VOLTAREN Take 75 mg by mouth twice daily   FLUoxetine 10 MG capsule Commonly known as:  PROZAC TAKE 1 CAPSULE BY MOUTH EVERY DAY   hydrochlorothiazide 25 MG tablet Commonly known as:  HYDRODIURIL Take 1 tablet (25 mg total) by mouth daily. (Needs to be seen before next refill)   levETIRAcetam 500 MG tablet Commonly known as:  KEPPRA TAKE ONE TABLET IN THE MORNING AND TWO AT NIGHT. Must f/u to receive future refills   norgestimate-ethinyl estradiol 0.25-35 MG-MCG tablet Commonly known as:   ORTHO-CYCLEN,SPRINTEC,PREVIFEM Take 1 tablet by mouth daily.   phentermine 37.5 MG tablet Commonly known as:  ADIPEX-P Take 1 tablet (37.5 mg total) by mouth daily before breakfast.          Objective:    BP 103/66   Pulse 72   Temp 97.8 F (36.6 C) (Oral)   Ht 5\' 5"  (1.651 m)   Wt 244 lb 12.8 oz (111 kg)   BMI 40.74 kg/m   No Known Allergies  Wt Readings from Last 3 Encounters:  09/23/18 244 lb 12.8 oz (111 kg)  07/29/18 250 lb 12.8 oz (113.8 kg)  06/22/18 268 lb (121.6 kg)    Physical Exam  Results for orders placed or performed during the hospital encounter of 09/21/17  Surgical pcr screen  Result Value Ref Range   MRSA, PCR NEGATIVE NEGATIVE   Staphylococcus aureus NEGATIVE NEGATIVE  CBC with Differential/Platelet  Result Value Ref Range   WBC 7.1 4.0 - 10.5 K/uL   RBC 4.55 3.87 - 5.11 MIL/uL   Hemoglobin 13.0 12.0 - 15.0 g/dL   HCT 83.7 29.0 - 21.1 %   MCV 91.6 78.0 - 100.0 fL   MCH 28.6 26.0 - 34.0 pg   MCHC 31.2 30.0 - 36.0 g/dL   RDW 15.5 20.8 -  15.5 %   Platelets 370 150 - 400 K/uL   Neutrophils Relative % 64 %   Neutro Abs 4.5 1.7 - 7.7 K/uL   Lymphocytes Relative 24 %   Lymphs Abs 1.7 0.7 - 4.0 K/uL   Monocytes Relative 8 %   Monocytes Absolute 0.6 0.1 - 1.0 K/uL   Eosinophils Relative 3 %   Eosinophils Absolute 0.2 0.0 - 0.7 K/uL   Basophils Relative 1 %   Basophils Absolute 0.1 0.0 - 0.1 K/uL  Basic metabolic panel  Result Value Ref Range   Sodium 139 135 - 145 mmol/L   Potassium 3.7 3.5 - 5.1 mmol/L   Chloride 107 101 - 111 mmol/L   CO2 23 22 - 32 mmol/L   Glucose, Bld 96 65 - 99 mg/dL   BUN 15 6 - 20 mg/dL   Creatinine, Ser 5.64 0.44 - 1.00 mg/dL   Calcium 8.5 (L) 8.9 - 10.3 mg/dL   GFR calc non Af Amer >60 >60 mL/min   GFR calc Af Amer >60 >60 mL/min   Anion gap 9 5 - 15  hCG, serum, qualitative  Result Value Ref Range   Preg, Serum NEGATIVE NEGATIVE      Assessment & Plan:   1. Morbid obesity (HCC) - phentermine (ADIPEX-P)  37.5 MG tablet; Take 1 tablet (37.5 mg total) by mouth daily before breakfast.  Dispense: 30 tablet; Refill: 1   Continue all other maintenance medications as listed above.  Follow up plan: No follow-ups on file.  Educational handout given for survey  Remus Loffler PA-C [facility] [address]  [place], [place] [address] [phone]   09/26/2018, 1:20 AM

## 2018-09-29 ENCOUNTER — Telehealth: Payer: Self-pay

## 2018-09-29 NOTE — Telephone Encounter (Signed)
INsurance denied prior auth for Phentermine

## 2018-12-06 ENCOUNTER — Ambulatory Visit: Payer: BLUE CROSS/BLUE SHIELD | Admitting: Physician Assistant

## 2018-12-06 ENCOUNTER — Ambulatory Visit: Payer: BLUE CROSS/BLUE SHIELD | Admitting: Family Medicine

## 2018-12-06 ENCOUNTER — Encounter: Payer: Self-pay | Admitting: Family Medicine

## 2018-12-06 ENCOUNTER — Other Ambulatory Visit: Payer: Self-pay

## 2018-12-06 VITALS — BP 119/74 | HR 78 | Temp 97.5°F | Ht 65.0 in | Wt 258.2 lb

## 2018-12-06 DIAGNOSIS — S39012A Strain of muscle, fascia and tendon of lower back, initial encounter: Secondary | ICD-10-CM

## 2018-12-06 MED ORDER — PREDNISONE 20 MG PO TABS: ORAL_TABLET | ORAL | 0 refills | Status: DC

## 2018-12-06 NOTE — Progress Notes (Signed)
BP 119/74   Pulse 78   Temp (!) 97.5 F (36.4 C) (Oral)   Ht 5\' 5"  (1.651 m)   Wt 258 lb 3.2 oz (117.1 kg)   BMI 42.97 kg/m    Subjective:   Patient ID: Brandy GibbonsJennifer N Huron, female    DOB: 1975/07/17, 43 y.o.   MRN: [id number]  HPI: Brandy GibbonsJennifer N Czerwonka is a 43 y.o. female presenting on 12/06/2018 for Back Pain (Patient states she has been having ongoing mid back pain that goes down )   HPI Patient comes in complaining of back pain that goes up and down her spine from her neck all the way down to her sacral area, it is worse just on the left side of the spine but it is on both sides.  She says it is been worse over the past couple weeks but she does have chronic back problems.  She admits that he got better and then now just recently is got worse, she has not tried her Flexeril but has been using the diclofenac and does feel like it helps usually but not as much this time.  She denies any trauma or specific event that brought us up on her.  She denies any fevers or chills or urinary problems.  She denies any abdominal pain or pain anywhere else.  She denies any numbness or weakness in either of her legs.  She rates the pain as moderate in intensity and just says it is not improving, it is worse after prolonged rest  Relevant past medical, surgical, family and social history reviewed and updated as indicated. Interim medical history since our last visit reviewed. Allergies and medications reviewed and updated.  Review of Systems  Constitutional: Negative for chills and fever.  Eyes: Negative for visual disturbance.  Respiratory: Negative for chest tightness and shortness of breath.   Cardiovascular: Negative for chest pain and leg swelling.  Genitourinary: Negative for difficulty urinating, dysuria, flank pain and hematuria.  Musculoskeletal: Positive for back pain and neck pain. Negative for gait problem.  Skin: Negative for rash.  Neurological: Negative for light-headedness and headaches.   Psychiatric/Behavioral: Negative for agitation and behavioral problems.  All other systems reviewed and are negative.   Per HPI unless specifically indicated above   Allergies as of 12/06/2018   No Known Allergies     Medication List       Accurate as of Dec 06, 2018  4:33 PM. If you have any questions, ask your nurse or doctor.        cyclobenzaprine 10 MG tablet Commonly known as:  FLEXERIL Take 1 tablet (10 mg total) by mouth 3 (three) times daily as needed for muscle spasms.   diclofenac 75 MG EC tablet Commonly known as:  VOLTAREN Take 75 mg by mouth twice daily   FLUoxetine 10 MG capsule Commonly known as:  PROZAC TAKE 1 CAPSULE BY MOUTH EVERY DAY   hydrochlorothiazide 25 MG tablet Commonly known as:  HYDRODIURIL Take 1 tablet (25 mg total) by mouth daily. (Needs to be seen before next refill)   levETIRAcetam 500 MG tablet Commonly known as:  KEPPRA TAKE ONE TABLET IN THE MORNING AND TWO AT NIGHT. Must f/u to receive future refills   norgestimate-ethinyl estradiol 0.25-35 MG-MCG tablet Commonly known as:  ORTHO-CYCLEN Take 1 tablet by mouth daily.   phentermine 37.5 MG tablet Commonly known as:  ADIPEX-P Take 1 tablet (37.5 mg total) by mouth daily before breakfast.   predniSONE 20 MG tablet  Commonly known as:  DELTASONE 2 po at same time daily for 5 days Started by:  Elige Radon Lurlene Ronda, MD        Objective:   BP 119/74   Pulse 78   Temp (!) 97.5 F (36.4 C) (Oral)   Ht 5\' 5"  (1.651 m)   Wt 258 lb 3.2 oz (117.1 kg)   BMI 42.97 kg/m   Wt Readings from Last 3 Encounters:  12/06/18 258 lb 3.2 oz (117.1 kg)  09/23/18 244 lb 12.8 oz (111 kg)  07/29/18 250 lb 12.8 oz (113.8 kg)    Physical Exam Vitals signs and nursing note reviewed.  Constitutional:      General: She is not in acute distress.    Appearance: She is well-developed. She is not diaphoretic.  Eyes:     Conjunctiva/sclera: Conjunctivae normal.  Cardiovascular:     Rate and  Rhythm: Normal rate and regular rhythm.     Heart sounds: Normal heart sounds. No murmur.  Pulmonary:     Effort: Pulmonary effort is normal. No respiratory distress.     Breath sounds: Normal breath sounds. No wheezing.  Abdominal:     General: Abdomen is flat. There is no distension.     Tenderness: There is no abdominal tenderness. There is no right CVA tenderness, left CVA tenderness, guarding or rebound.  Musculoskeletal: Normal range of motion.        General: Tenderness (Bilateral paraspinal tenderness, worse on left than right, negative straight leg raise bilaterally.) present.  Skin:    General: Skin is warm and dry.     Findings: No rash.  Neurological:     Mental Status: She is alert and oriented to person, place, and time.     Coordination: Coordination normal.  Psychiatric:        Behavior: Behavior normal.       Assessment & Plan:   Problem List Items Addressed This Visit    None    Visit Diagnoses    Back strain, initial encounter    -  Primary   Patient has chronic back pain is been worse over the past couple weeks, she has gained weight recently, physical therapy and prednisone   Relevant Medications   predniSONE (DELTASONE) 20 MG tablet   Other Relevant Orders   Ambulatory referral to Physical Therapy    Likely muscular in origin, will do physical therapy and anti-inflammatory and return if worsens or does not improve.  Follow up plan: Return if symptoms worsen or fail to improve.  Counseling provided for all of the vaccine components Orders Placed This Encounter  Procedures  . Ambulatory referral to Physical Therapy    Arville Care, MD [facility] 12/06/2018, 4:33 PM

## 2018-12-20 ENCOUNTER — Other Ambulatory Visit: Payer: Self-pay

## 2018-12-20 ENCOUNTER — Encounter: Payer: Self-pay | Admitting: Physical Therapy

## 2018-12-20 ENCOUNTER — Ambulatory Visit: Payer: BC Managed Care – PPO | Attending: Family Medicine | Admitting: Physical Therapy

## 2018-12-20 DIAGNOSIS — M545 Low back pain, unspecified: Secondary | ICD-10-CM

## 2018-12-20 DIAGNOSIS — M6281 Muscle weakness (generalized): Secondary | ICD-10-CM | POA: Diagnosis not present

## 2018-12-20 DIAGNOSIS — M546 Pain in thoracic spine: Secondary | ICD-10-CM | POA: Diagnosis not present

## 2018-12-20 NOTE — Therapy (Signed)
[facility] [place], [place], [address] Phone: [phone]   Fax:  [phone]  Physical Therapy Evaluation  Patient Details  Name: Brandy Jefferson MRN: [id number] Date of Birth: Apr 03, 1976 Referring Provider (PT): Caryl Pina, MD   Encounter Date: 12/20/2018  PT End of Session - 12/20/18 1544    Visit Number  1    Number of Visits  6    Date for PT Re-Evaluation  02/14/19    PT Start Time  0916    PT Stop Time  1000    PT Time Calculation (min)  44 min    Activity Tolerance  Patient tolerated treatment well;Patient limited by pain    Behavior During Therapy  [facility] for tasks assessed/performed       Past Medical History:  Diagnosis Date  . Arteriovenous malformation of brain    Left temporal  . Partial seizure ([place]) 12/07/2012  . Plantar fasciitis    both feet  . Seizures ([place])    Partial seizures, right hemisensory    Past Surgical History:  Procedure Laterality Date  . Arteriovenous malformation ablation  2004   Left temporal, intravascular procedure  . MASS EXCISION N/A 09/29/2017   Procedure: EXCISION SEBACEOUS 2CM CYST ON BACK;  Surgeon: Virl Cagey, MD;  Location: [facility];  Service: General;  Laterality: N/A;    There were no vitals filed for this visit.   Subjective Assessment - 12/20/18 1556    Subjective  COVID-19 screening performed prior to entering the building. Patient arrives to physical therapy with reports of low back pain that exacerbated 3 weeks ago. Patient attributes pain due to one week off, one week on work schedule that doesn't allow patient's body to fully work to strengthen her back. Patient reports most pain in the low back but can work up toward the upper thoracic spine and shoulders. Patient reports the mornings are the worst and requires increased time to perform ADLs such as showering and dressing. Patient reports pain at worst is 9/10 and pain at best is 5/10. Patient has  used ibuprofen and icy hot with temporary relief. Patient's goals are to decrease pain, improve movement and improve ability to perform work activities.    Pertinent History  history of seizures and epilepsy.    Limitations  Standing;Walking;House hold activities;Sitting    Diagnostic tests  n/a    Patient Stated Goals  decrease pain and improve ability to work.    Currently in Pain?  Yes    Pain Score  5     Pain Location  Back    Pain Orientation  Left;Lower;Right    Pain Descriptors / Indicators  Sore;Aching;Sharp    Pain Type  Acute pain    Pain Onset  1 to 4 weeks ago    Pain Frequency  Constant    Aggravating Factors   certain movements    Pain Relieving Factors  "movement, icy hot, ibuprofen temporary relief."    Effect of Pain on Daily Activities  difficulty with peforming ADLs due to pain but able to complete all.         [facility] PT Assessment - 12/20/18 0001      Assessment   Medical Diagnosis  Back strain    Referring Provider (PT)  Caryl Pina, MD    Onset Date/Surgical Date  --   "3 weeks ago" May 2020   Next MD Visit  n/a    Prior Therapy  no  Precautions   Precautions  None      Restrictions   Weight Bearing Restrictions  No      Balance Screen   Has the patient fallen in the past 6 months  No    Has the patient had a decrease in activity level because of a fear of falling?   No    Is the patient reluctant to leave their home because of a fear of falling?   No      Home Public house managernvironment   Living Environment  Private residence      Prior Function   Level of Independence  Independent      ROM / Strength   AROM / PROM / Strength  AROM;Strength      AROM   Overall AROM   Due to pain    Overall AROM Comments  pain with returning to neutral position    AROM Assessment Site  Lumbar    Lumbar Flexion  2" finger tip to floor    Lumbar Extension  13 degrees    Lumbar - Right Side Bend  21" finger tip to floor    Lumbar - Left Side Bend  21" finger tip  to floor      Strength   Strength Assessment Site  Knee;Hip    Right/Left Hip  Right;Left    Right Hip Flexion  4/5    Right Hip Extension  4-/5    Right Hip ABduction  4-/5    Left Hip Flexion  4-/5    Left Hip Extension  4-/5    Left Hip ABduction  3+/5    Right/Left Knee  Left;Right    Right Knee Flexion  4/5    Right Knee Extension  4+/5    Left Knee Flexion  4/5    Left Knee Extension  4+/5      Palpation   SI assessment   equal leg lengths and equal ASIS    Palpation comment  Very tender to palpation to bilateral QLs L>R, left piriformis, and lumbar paraspinals. minimal tenderness to rhomboids.      Transfers   Transfers  Independent with all Transfers      Ambulation/Gait   Gait Pattern  Within Functional Limits                Objective measurements completed on examination: See above findings.              PT Education - 12/20/18 1600    Education Details  draw ins, LTR, hip adduction, hip abduction, bridges, piriformis stretch, scapular retractions    Person(s) Educated  Patient    Methods  Handout;Explanation;Demonstration    Comprehension  Verbalized understanding;Returned demonstration          PT Long Term Goals - 12/20/18 1546      PT LONG TERM GOAL #1   Title  Patient will be independent with HEP    Time  6    Period  Weeks    Status  New      PT LONG TERM GOAL #2   Title  Patient will report ability to perform ADLs and work activities with pain in low back less than or equal to 4/10.    Time  6    Period  Weeks    Status  New      PT LONG TERM GOAL #3   Title  Patient will demonstrate 4+/5 or greater bilateral LE MMT to improve stability during functional  tasks.    Time  6    Period  Weeks    Status  New      PT LONG TERM GOAL #4   Title  Patient will demonstrate proper lifting body mechanics to protect spine durig work activities.     Time  6    Period  Weeks    Status  New             Plan - 12/20/18  1600    Clinical Impression Statement  Patient is a 43 year old female who presents to physical therapy with low back pain and decreased bilateral LE MMT. Patient is very tender to palpation to bilateral QLs L>R, L piriformis and bilateral lumbat paraspinals. Patient demonstrates proper log rolling technique. Due to a high copay patient requested to limit her physical therapy visits. Patient was provided with HEP and was instructed with all. Patient denied any pain with exercises. Patient instructed to call if any questions. Patient would benefit from skilled physical therapy to address deficits and address patient's goals.      Personal Factors and Comorbidities  Comorbidity 1    Comorbidities  seizures    Examination-Activity Limitations  Bend;Caring for Others;Sit;Dressing;Stand;Stairs;Squat    Examination-Participation Restrictions  Cleaning    Stability/Clinical Decision Making  Stable/Uncomplicated    Clinical Decision Making  Low    Rehab Potential  Good    PT Frequency  1x / week    PT Duration  6 weeks    PT Treatment/Interventions  ADLs/Self Care Home Management;Electrical Stimulation;Moist Heat;Traction;Cryotherapy;Therapeutic exercise;Functional mobility training;Therapeutic activities;Balance training;Neuromuscular re-education;Manual techniques;Dry needling;Passive range of motion;Patient/family education;Spinal Manipulations    PT Next Visit Plan  assess HEP, progress as necessary nustep, gentle strengthening and streching, modalities PRN for pain relief.    PT Home Exercise Plan  see patient education section    Consulted and Agree with Plan of Care  Patient       Patient will benefit from skilled therapeutic intervention in order to improve the following deficits and impairments:  Pain, Postural dysfunction, Decreased activity tolerance, Decreased strength  Visit Diagnosis: Acute bilateral low back pain, unspecified whether sciatica present  Pain in thoracic spine  Muscle  weakness (generalized)     Problem List Patient Active Problem List   Diagnosis Date Noted  . Sebaceous cyst   . Morbid obesity (HCC) 07/28/2017  . Partial seizure (HCC) 12/07/2012  . History of arteriovenous malformation ([facility]) 12/07/2012    Guss BundeKrystle Baden Betsch, PT, DPT 12/20/2018, 4:04 PM  [facility] [address] [place], [place], [address] Phone: [phone]   Fax:  [phone]  Name: Brandy Jefferson MRN: [id number] Date of Birth: 1976-06-12

## 2019-01-03 ENCOUNTER — Ambulatory Visit: Payer: BC Managed Care – PPO | Admitting: Physical Therapy

## 2019-01-25 ENCOUNTER — Other Ambulatory Visit: Payer: Self-pay | Admitting: Neurology

## 2019-01-26 ENCOUNTER — Other Ambulatory Visit: Payer: Self-pay | Admitting: Physician Assistant

## 2019-02-01 NOTE — Progress Notes (Signed)
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  She has history of seizures and has done well taking Keppra 500 in the morning, 1000 mg in the evening. She is tolerating the medication well. Her last seizure was 3-4 years ago. Her overall health as been good. Her arthritis is acting up, especially in her right shoulder. 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 She is on phentermine for weight loss.  She presents today for follow-up unaccompanied.  HISTORY 10/28/2017 Dr. Anne HahnWillis: Brandy Jefferson is a 43 year old right-handed white female with a history of an arteriovenous malformation in the left temporal area that was ablated, she has a history of seizures, she has done well on Keppra taking 500 mg in the morning and 1000 mg in the evening.  The patient tolerates the medication well without drowsiness or irritability.  She reports no seizures since last seen about 2 years ago.  The patient operates a motor vehicle without difficulty.  She recently had a cyst removed from her left shoulder but otherwise no new medical problems have been noted.  She returns to this office for an evaluation.  REVIEW OF SYSTEMS: Out of a complete 14 system review of symptoms, the patient complains only of the following symptoms, and all other reviewed systems are negative.  Seizures  ALLERGIES: No Known Allergies  HOME MEDICATIONS: Outpatient Medications Prior to Visit  Medication Sig Dispense Refill  . cyclobenzaprine (FLEXERIL) 10 MG tablet Take 1 tablet (10 mg total) by mouth 3 (three) times daily as needed for muscle spasms. 60 tablet 0  . diclofenac (VOLTAREN) 75 MG EC tablet Take 75 mg by mouth twice daily  0  . FLUoxetine (PROZAC) 10 MG capsule TAKE 1 CAPSULE BY  MOUTH EVERY DAY 90 capsule 3  . hydrochlorothiazide (HYDRODIURIL) 25 MG tablet Take 1 tablet (25 mg total) by mouth daily. (Needs to be seen before next refill) 90 tablet 3  . levETIRAcetam (KEPPRA) 500 MG tablet TAKE ONE TABLET IN THE MORNING AND TWO AT NIGHT. Must f/u to receive future refills 270 tablet 3  . norgestimate-ethinyl estradiol (ORTHO-CYCLEN,SPRINTEC,PREVIFEM) 0.25-35 MG-MCG tablet Take 1 tablet by mouth daily.    . phentermine (ADIPEX-P) 37.5 MG tablet Take 1 tablet (37.5 mg total) by mouth daily before breakfast. 30 tablet 1  . predniSONE (DELTASONE) 20 MG tablet 2 po at same time daily for 5 days 10 tablet 0   No facility-administered medications prior to visit.     PAST MEDICAL HISTORY: Past Medical History:  Diagnosis Date  . Arteriovenous malformation of brain    Left temporal  . Partial seizure (HCC) 12/07/2012  . Plantar fasciitis    both feet  . Seizures (HCC)    Partial seizures, right hemisensory    PAST SURGICAL HISTORY: Past Surgical History:  Procedure Laterality Date  . Arteriovenous malformation ablation  2004   Left temporal, intravascular procedure  . MASS EXCISION N/A 09/29/2017   Procedure: EXCISION SEBACEOUS 2CM CYST ON BACK;  Surgeon: Lucretia RoersBridges, Lindsay C, MD;  Location: [facility];  Service: General;  Laterality: N/A;    FAMILY HISTORY: Family History  Problem Relation Age of Onset  . Cancer Father     SOCIAL HISTORY: Social History   Socioeconomic History  . Marital status:  Single    Spouse name: Not on file  . Number of children: 1  . Years of education: 50  . Highest education level: Not on file  Occupational History    Employer: [employer]  . Financial resource strain: Not on file  . Food insecurity    Worry: Not on file    Inability: Not on file  . Transportation needs    Medical: Not on file    Non-medical: Not on file  Tobacco Use  . Smoking status: Never Smoker  . Smokeless tobacco: Never Used   Substance and Sexual Activity  . Alcohol use: No    Comment: Occasional  . Drug use: No  . Sexual activity: Not on file  Lifestyle  . Physical activity    Days per week: Not on file    Minutes per session: Not on file  . Stress: Not on file  Relationships  . Social Herbalist on phone: Not on file    Gets together: Not on file    Attends religious service: Not on file    Active member of club or organization: Not on file    Attends meetings of clubs or organizations: Not on file    Relationship status: Not on file  . Intimate partner violence    Fear of current or ex partner: Not on file    Emotionally abused: Not on file    Physically abused: Not on file    Forced sexual activity: Not on file  Other Topics Concern  . Not on file  Social History Narrative   Patient lives at home with daughter and her daughters dad.    Patient is not married.    Patient has 1 child.    Patient is currently working.    Patient drinks about 5 cups of caffeine daily   Patient is right handed.         PHYSICAL EXAM  There were no vitals filed for this visit. There is no height or weight on file to calculate BMI.  Generalized: Well developed, in no acute distress   Neurological examination  Mentation: Alert oriented to time, place, history taking. Follows all commands speech and language fluent Cranial nerve II-XII: Pupils were equal round reactive to light. Extraocular movements were full, visual field were full on confrontational test. Facial sensation and strength were normal. Uvula tongue midline. Head turning and shoulder shrug  were normal and symmetric. Motor: The motor testing reveals 5 over 5 strength of all 4 extremities, limited ROM right shoulder pain.  Good symmetric motor tone is noted throughout.  Sensory: Sensory testing is intact to soft touch on all 4 extremities. No evidence of extinction is noted.  Coordination: Cerebellar testing reveals good  finger-nose-finger and heel-to-shin bilaterally.  Gait and station: Gait is normal. Tandem gait is normal. Romberg is negative. No drift is seen.  Reflexes: Deep tendon reflexes are symmetric and normal bilaterally.   DIAGNOSTIC DATA (LABS, IMAGING, TESTING) - I reviewed patient records, labs, notes, testing and imaging myself where available.  Lab Results  Component Value Date   WBC 7.1 09/21/2017   HGB 13.0 09/21/2017   HCT 41.7 09/21/2017   MCV 91.6 09/21/2017   PLT 370 09/21/2017      Component Value Date/Time   NA 139 09/21/2017 1504   K 3.7 09/21/2017 1504   CL 107 09/21/2017 1504   CO2 23 09/21/2017 1504   GLUCOSE 96 09/21/2017 1504  BUN 15 09/21/2017 1504   CREATININE 0.65 09/21/2017 1504   CALCIUM 8.5 (L) 09/21/2017 1504   GFRNONAA >60 09/21/2017 1504   GFRAA >60 09/21/2017 1504   No results found for: CHOL, HDL, LDLCALC, LDLDIRECT, TRIG, CHOLHDL No results found for: ZOXW9UHGBA1C No results found for: VITAMINB12 No results found for: TSH    ASSESSMENT AND PLAN 43 y.o. year old female  has a past medical history of Arteriovenous malformation of brain, Partial seizure (HCC) (12/07/2012), Plantar fasciitis, and Seizures (HCC). here with:  1.  History of seizures, well controlled  Overall, the patient is doing quite well.  She has not had recurrent seizure.  She will continue taking Keppra 500 mg in the morning, 1000 mg in the evening.  I have sent in a prescription.  She will follow-up in 1 year or sooner if needed.   I spent 15 minutes with the patient. 50% of this time was spent discussing her plan of care.   Margie EgeSarah , AGNP-C, DNP 02/02/2019, 9:12 AM [facility] [address], [address] [place], [place] [address] [phone]

## 2019-02-02 ENCOUNTER — Other Ambulatory Visit: Payer: Self-pay

## 2019-02-02 ENCOUNTER — Encounter: Payer: Self-pay | Admitting: Neurology

## 2019-02-02 ENCOUNTER — Ambulatory Visit (INDEPENDENT_AMBULATORY_CARE_PROVIDER_SITE_OTHER): Payer: BLUE CROSS/BLUE SHIELD | Admitting: Neurology

## 2019-02-02 VITALS — BP 137/90 | HR 73 | Temp 98.4°F | Ht 65.0 in | Wt 254.8 lb

## 2019-02-02 DIAGNOSIS — R569 Unspecified convulsions: Secondary | ICD-10-CM

## 2019-02-02 MED ORDER — LEVETIRACETAM 500 MG PO TABS: ORAL_TABLET | ORAL | 3 refills | Status: DC

## 2019-02-02 NOTE — Patient Instructions (Signed)
It was wonderful to meet you! You look great today! I will see you in 1 year!  

## 2019-02-02 NOTE — Progress Notes (Signed)
I have read the note, and I agree with the clinical assessment and plan.  Charles K Willis   

## 2019-02-04 DIAGNOSIS — M25561 Pain in right knee: Secondary | ICD-10-CM | POA: Diagnosis not present

## 2019-02-04 DIAGNOSIS — M25511 Pain in right shoulder: Secondary | ICD-10-CM | POA: Diagnosis not present

## 2019-02-04 DIAGNOSIS — M25562 Pain in left knee: Secondary | ICD-10-CM | POA: Diagnosis not present

## 2019-02-04 DIAGNOSIS — M25512 Pain in left shoulder: Secondary | ICD-10-CM | POA: Diagnosis not present

## 2019-03-07 DIAGNOSIS — M25561 Pain in right knee: Secondary | ICD-10-CM | POA: Diagnosis not present

## 2019-03-13 ENCOUNTER — Other Ambulatory Visit: Payer: Self-pay | Admitting: Orthopedic Surgery

## 2019-03-13 DIAGNOSIS — M25561 Pain in right knee: Secondary | ICD-10-CM

## 2019-03-23 ENCOUNTER — Ambulatory Visit
Admission: RE | Admit: 2019-03-23 | Discharge: 2019-03-23 | Disposition: A | Discharge status: Home / Self Care | Payer: BC Managed Care – PPO | Source: Ambulatory Visit | Attending: Orthopedic Surgery | Admitting: Orthopedic Surgery

## 2019-03-23 ENCOUNTER — Other Ambulatory Visit: Payer: Self-pay | Admitting: Orthopedic Surgery

## 2019-03-23 ENCOUNTER — Other Ambulatory Visit: Payer: Self-pay

## 2019-03-23 DIAGNOSIS — M25561 Pain in right knee: Secondary | ICD-10-CM

## 2019-04-03 ENCOUNTER — Ambulatory Visit
Admission: RE | Admit: 2019-04-03 | Discharge: 2019-04-03 | Disposition: A | Discharge status: Home / Self Care | Payer: BC Managed Care – PPO | Source: Ambulatory Visit | Attending: Orthopedic Surgery | Admitting: Orthopedic Surgery

## 2019-04-03 ENCOUNTER — Other Ambulatory Visit: Payer: Self-pay

## 2019-04-03 DIAGNOSIS — M25561 Pain in right knee: Secondary | ICD-10-CM | POA: Diagnosis not present

## 2019-04-03 MED ORDER — IOPAMIDOL (ISOVUE-M 200) INJECTION 41%
30.0000 mL | Freq: Once | INTRAMUSCULAR | Status: AC
  Administered 2019-04-03: 30 mL via INTRA_ARTICULAR

## 2019-04-06 DIAGNOSIS — M25561 Pain in right knee: Secondary | ICD-10-CM | POA: Diagnosis not present

## 2019-06-27 ENCOUNTER — Other Ambulatory Visit: Payer: Self-pay | Admitting: Physician Assistant

## 2019-06-27 DIAGNOSIS — R601 Generalized edema: Secondary | ICD-10-CM

## 2019-06-29 DIAGNOSIS — Z01419 Encounter for gynecological examination (general) (routine) without abnormal findings: Secondary | ICD-10-CM | POA: Diagnosis not present

## 2019-06-29 DIAGNOSIS — Z6841 Body Mass Index (BMI) 40.0 and over, adult: Secondary | ICD-10-CM | POA: Diagnosis not present

## 2019-07-06 ENCOUNTER — Other Ambulatory Visit: Payer: Self-pay | Admitting: Physician Assistant

## 2019-07-06 DIAGNOSIS — R601 Generalized edema: Secondary | ICD-10-CM

## 2019-08-07 ENCOUNTER — Other Ambulatory Visit: Payer: Self-pay | Admitting: Physician Assistant

## 2019-08-07 DIAGNOSIS — R601 Generalized edema: Secondary | ICD-10-CM

## 2019-08-07 NOTE — Telephone Encounter (Signed)
Patient needs to be seen. Last appt 09/2018.

## 2019-08-07 NOTE — Telephone Encounter (Signed)
Lmtcb to schedule appt.

## 2019-09-06 ENCOUNTER — Encounter: Payer: Self-pay | Admitting: Physician Assistant

## 2019-09-06 ENCOUNTER — Telehealth (INDEPENDENT_AMBULATORY_CARE_PROVIDER_SITE_OTHER): Payer: BC Managed Care – PPO | Admitting: Physician Assistant

## 2019-09-06 DIAGNOSIS — F320 Major depressive disorder, single episode, mild: Secondary | ICD-10-CM

## 2019-09-06 DIAGNOSIS — R601 Generalized edema: Secondary | ICD-10-CM | POA: Diagnosis not present

## 2019-09-06 MED ORDER — HYDROCHLOROTHIAZIDE 25 MG PO TABS: 25.0000 mg | ORAL_TABLET | Freq: Every day | ORAL | 3 refills | Status: DC

## 2019-09-06 MED ORDER — FLUOXETINE HCL 10 MG PO CAPS: ORAL_CAPSULE | ORAL | 3 refills | Status: DC

## 2019-09-06 MED ORDER — PHENTERMINE HCL 37.5 MG PO TABS: 37.5000 mg | ORAL_TABLET | Freq: Every day | ORAL | 1 refills | Status: DC

## 2019-09-06 NOTE — Progress Notes (Signed)
Video visit  Subjective: CC: Recheck on chronic conditions PCP: Remus Loffler, PA-C [id number] Brandy Jefferson is a 44 y.o. female calls for video visit today. Patient provides verbal consent for consult held via phone.  Patient is identified with 2 separate identifiers.  At this time the entire area is on COVID-19 social distancing and stay home orders are in place.  Patient is of higher risk and therefore we are performing this by a virtual method.  Location of patient: home Location of provider: HOME Others present for call: no  This patient is having a follow-up on her chronic conditions edema relieved by hydrochlorothiazide, depression and anxiety improved by fluoxetine, obesity.  She does need refills on several of her medications.  These will be sent in.  I would like for her to come in a couple months to have lab work done a.m. measurements in the office.  She is going to bring her work labs by when she comes them.  And that might be all that we need.  She denies have any other issues at this time.   ROS: Per HPI  No Known Allergies Past Medical History:  Diagnosis Date  . Arteriovenous malformation of brain    Left temporal  . Partial seizure (HCC) 12/07/2012  . Plantar fasciitis    both feet  . Seizures (HCC)    Partial seizures, right hemisensory    Current Outpatient Medications:  .  cyclobenzaprine (FLEXERIL) 10 MG tablet, Take 1 tablet (10 mg total) by mouth 3 (three) times daily as needed for muscle spasms., Disp: 60 tablet, Rfl: 0 .  diclofenac (VOLTAREN) 75 MG EC tablet, Take 75 mg by mouth twice daily, Disp: , Rfl: 0 .  FLUoxetine (PROZAC) 10 MG capsule, TAKE 1 CAPSULE BY MOUTH EVERY DAY, Disp: 90 capsule, Rfl: 3 .  hydrochlorothiazide (HYDRODIURIL) 25 MG tablet, Take 1 tablet (25 mg total) by mouth daily., Disp: 90 tablet, Rfl: 3 .  levETIRAcetam (KEPPRA) 500 MG tablet, TAKE ONE TABLET IN THE MORNING AND TWO AT NIGHT. Must f/u to receive future refills, Disp:  270 tablet, Rfl: 3 .  norgestimate-ethinyl estradiol (ORTHO-CYCLEN,SPRINTEC,PREVIFEM) 0.25-35 MG-MCG tablet, Take 1 tablet by mouth daily., Disp: , Rfl:  .  phentermine (ADIPEX-P) 37.5 MG tablet, Take 1 tablet (37.5 mg total) by mouth daily before breakfast., Disp: 30 tablet, Rfl: 1  Assessment/ Plan: 44 y.o. female   1. Generalized edema - hydrochlorothiazide (HYDRODIURIL) 25 MG tablet; Take 1 tablet (25 mg total) by mouth daily.  Dispense: 90 tablet; Refill: 3  2. Depression, major, single episode, mild (HCC) - FLUoxetine (PROZAC) 10 MG capsule; TAKE 1 CAPSULE BY MOUTH EVERY DAY  Dispense: 90 capsule; Refill: 3  3. Morbid obesity (HCC) - phentermine (ADIPEX-P) 37.5 MG tablet; Take 1 tablet (37.5 mg total) by mouth daily before breakfast.  Dispense: 30 tablet; Refill: 1   No follow-ups on file.  Continue all other maintenance medications as listed above.  Start time: 3:40 PM End time: 3:52 PM  Meds ordered this encounter  Medications  . hydrochlorothiazide (HYDRODIURIL) 25 MG tablet    Sig: Take 1 tablet (25 mg total) by mouth daily.    Dispense:  90 tablet    Refill:  3    Order Specific Question:   Supervising Provider    Answer:   Raliegh Ip [[id number]]  . FLUoxetine (PROZAC) 10 MG capsule    Sig: TAKE 1 CAPSULE BY MOUTH EVERY DAY    Dispense:  90  capsule    Refill:  3    Order Specific Question:   Supervising Provider    Answer:   Janora Norlander [[id number]]  . phentermine (ADIPEX-P) 37.5 MG tablet    Sig: Take 1 tablet (37.5 mg total) by mouth daily before breakfast.    Dispense:  30 tablet    Refill:  1    Order Specific Question:   Supervising Provider    Answer:   Janora Norlander [[id number]]    Particia Nearing PA-C [place] [phone]

## 2019-10-17 DIAGNOSIS — M79672 Pain in left foot: Secondary | ICD-10-CM | POA: Diagnosis not present

## 2019-10-17 DIAGNOSIS — M76821 Posterior tibial tendinitis, right leg: Secondary | ICD-10-CM | POA: Diagnosis not present

## 2019-10-17 DIAGNOSIS — M79671 Pain in right foot: Secondary | ICD-10-CM | POA: Diagnosis not present

## 2019-10-17 DIAGNOSIS — M76822 Posterior tibial tendinitis, left leg: Secondary | ICD-10-CM | POA: Diagnosis not present

## 2019-10-22 IMAGING — CT CT KNEE*R* W/CM
1 series · 12 of 14 positions shown, 15 images · IV contrast (agent unspecified)
Comparison: X-ray 07/28/2017

CLINICAL DATA: Right knee pain

EXAM:
CT OF THE RIGHT KNEE WITH CONTRAST
TECHNIQUE: CT of the right knee was performed following the administration of
intra-articular contrast. See injection documentation for contrast
information.

[Series 4: knee soft tissue · axial · 0.56mm/px · z∈[-162,-9]mm · 12 of 61 slices shown, 15 images]
[im 5/61  soft-tissue]
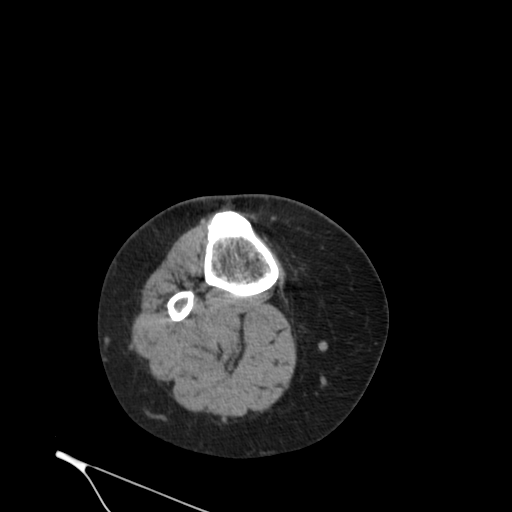
[im 5/61  bone]
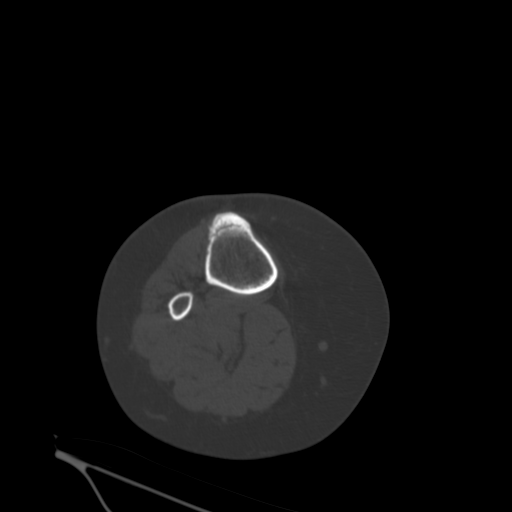
[im 10/61  bone]
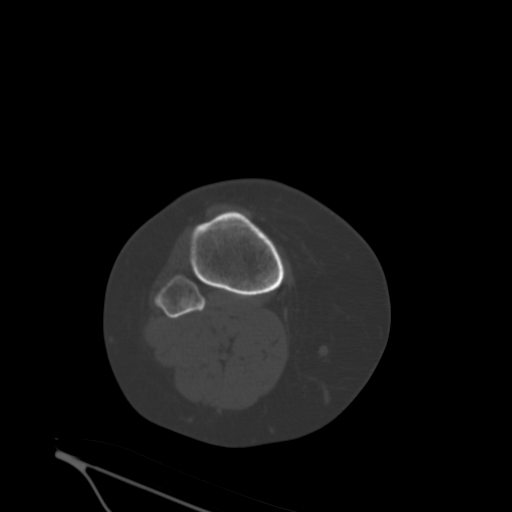
[im 14/61  bone]
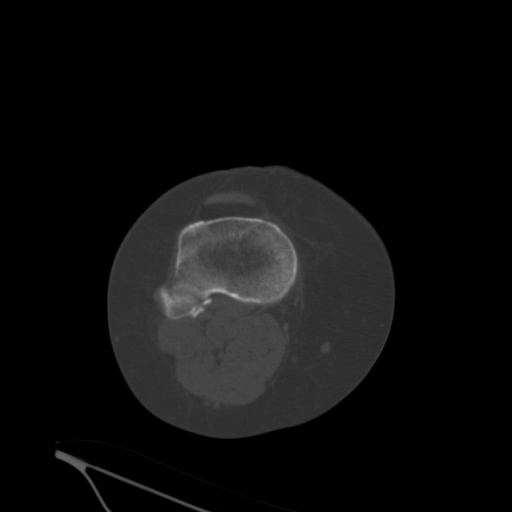
[im 19/61  bone]
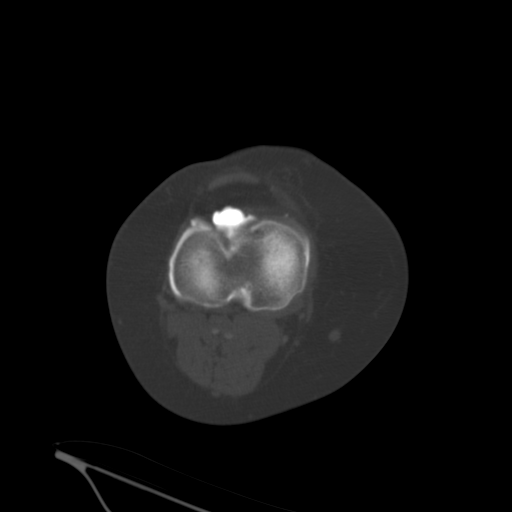
[im 24/61  soft-tissue]
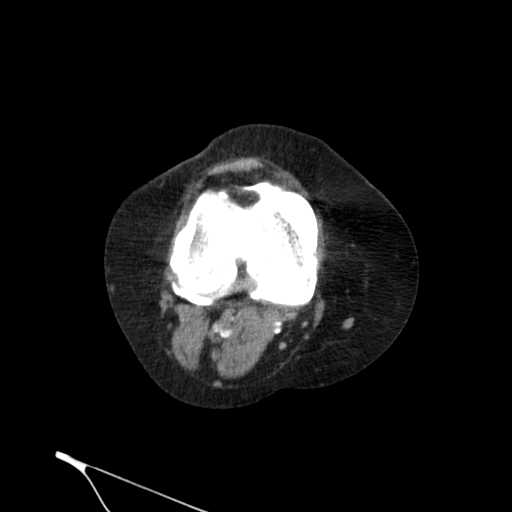
[im 24/61  bone]
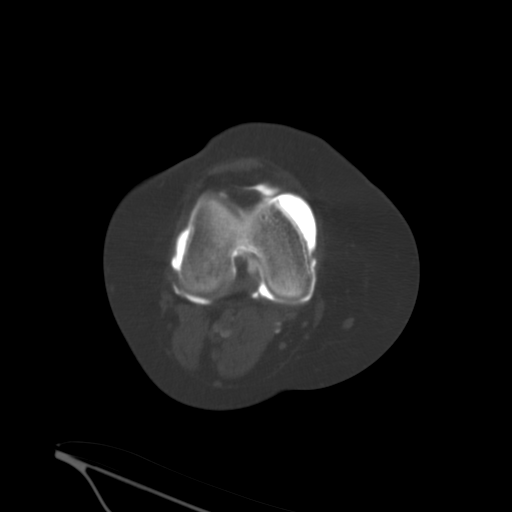
[im 28/61  bone]
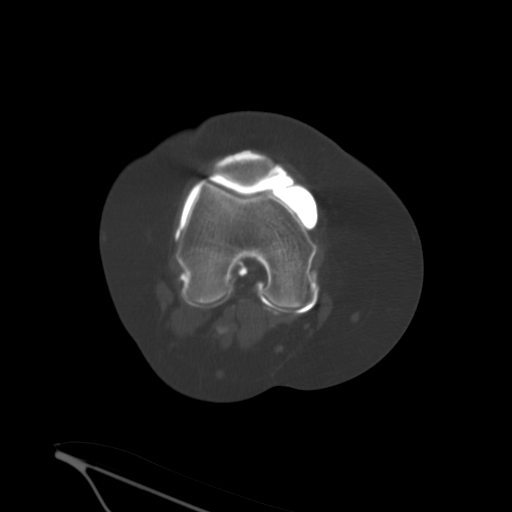
[im 33/61  bone]
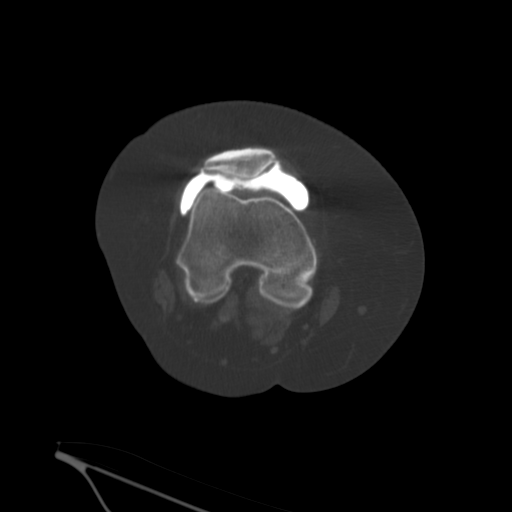
[im 37/61  bone]
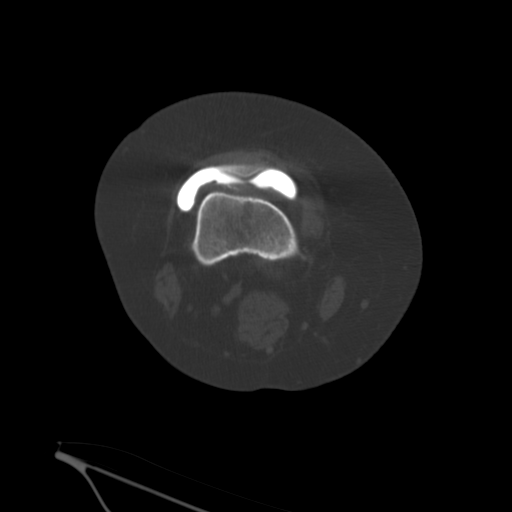
[im 42/61  soft-tissue]
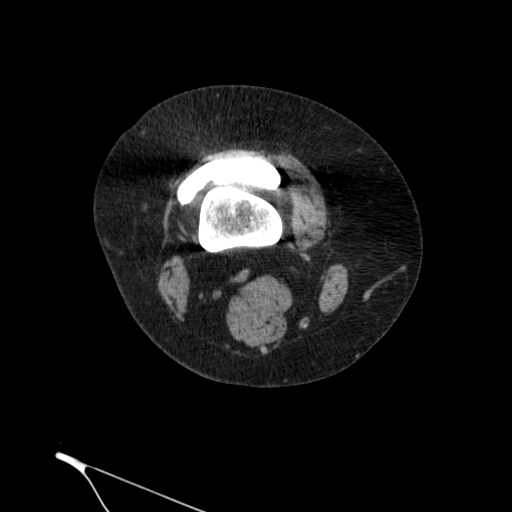
[im 42/61  bone]
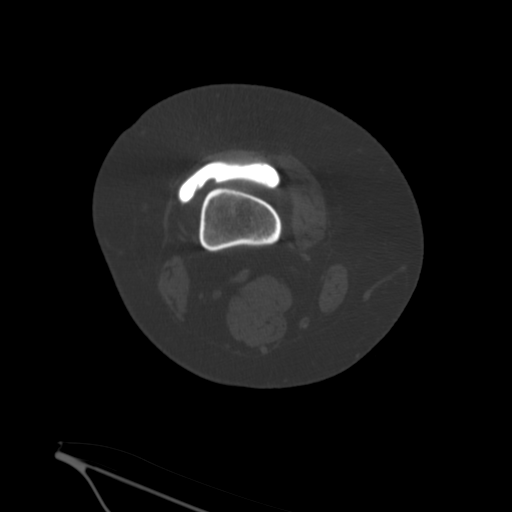
[im 47/61  bone]
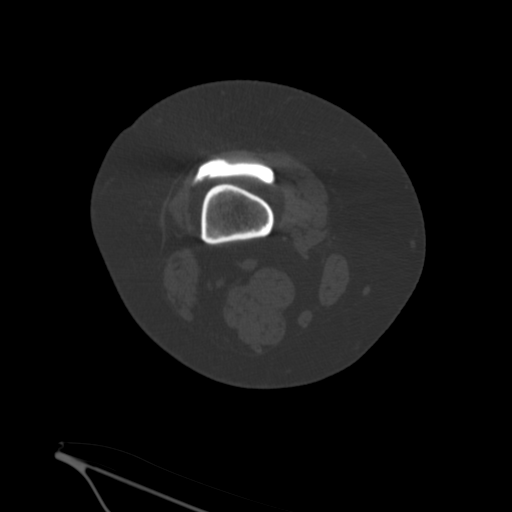
[im 51/61  bone]
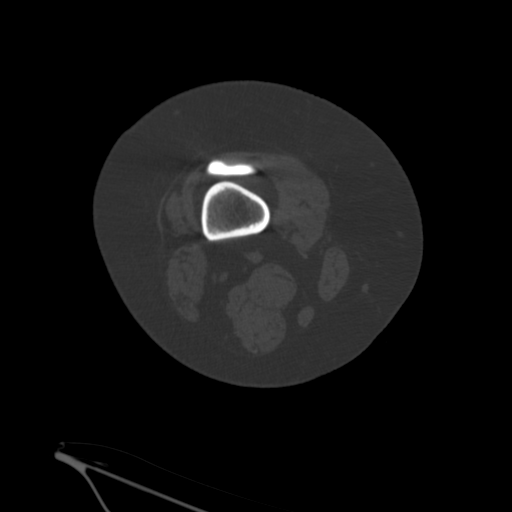
[im 56/61  bone]
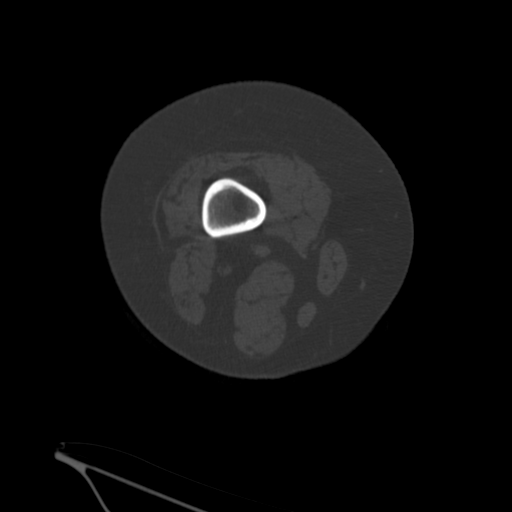

[12 of 14 positions shown; findings below may reference images not displayed]

FINDINGS: MENISCI

Medial meniscus: Medial meniscus is partially extruded without a
discrete tear.

Lateral meniscus:  Intact.

LIGAMENTS

Cruciates:  ACL and PCL appear intact.

Collaterals:  Suboptimally evaluated.  Grossly intact.

CARTILAGE

Patellofemoral: Extensive cartilage thinning and irregularity with
areas of near full-thickness fissuring of the patellar cartilage
with delamination component within the central aspect of the lateral
patellar facet (series 3, image 31). Moderate cartilage thinning and
irregularity of the trochlea, medial worse than lateral.

Medial: Diffuse thinning and surface irregularity of the
weight-bearing surfaces with a focal 7 x 3 mm cartilage defect
centrally (series 7, image 44).

Lateral: Cartilage delamination of the weight-bearing lateral
femoral condyle (series 7, image 42).

Joint:  Well distended with intra-articular contrast.

Popliteal Fossa: Tiny Baker's cyst. Popliteus tendon grossly intact.

Extensor Mechanism:  Quadriceps and patellar tendons are intact.

Bones: Tricompartmental joint space narrowing with small marginal
osteophytes and scattered subchondral cystic changes most pronounced
within the inferior aspect of the medial trochlea. No fracture.

Other: None.
IMPRESSION: 1. Advanced tricompartmental cartilage abnormalities, most severe
within the patellofemoral compartment.
2. Partially extruded medial meniscus without discrete tear.

## 2019-11-14 DIAGNOSIS — M722 Plantar fascial fibromatosis: Secondary | ICD-10-CM | POA: Diagnosis not present

## 2019-11-14 DIAGNOSIS — M79672 Pain in left foot: Secondary | ICD-10-CM | POA: Diagnosis not present

## 2019-11-14 DIAGNOSIS — M79671 Pain in right foot: Secondary | ICD-10-CM | POA: Diagnosis not present

## 2019-12-05 ENCOUNTER — Other Ambulatory Visit: Payer: Self-pay | Admitting: *Deleted

## 2019-12-05 DIAGNOSIS — M7661 Achilles tendinitis, right leg: Secondary | ICD-10-CM | POA: Diagnosis not present

## 2019-12-05 DIAGNOSIS — M79671 Pain in right foot: Secondary | ICD-10-CM | POA: Diagnosis not present

## 2020-01-24 ENCOUNTER — Other Ambulatory Visit: Payer: Self-pay | Admitting: Neurology

## 2020-02-24 ENCOUNTER — Other Ambulatory Visit: Payer: Self-pay | Admitting: Neurology

## 2020-02-26 NOTE — Telephone Encounter (Signed)
Refilled keppra x 1 month. Sent her my chart advising she schedule a FU.

## 2020-03-22 ENCOUNTER — Other Ambulatory Visit: Payer: Self-pay | Admitting: Neurology

## 2020-04-09 DIAGNOSIS — M79672 Pain in left foot: Secondary | ICD-10-CM | POA: Diagnosis not present

## 2020-04-09 DIAGNOSIS — M722 Plantar fascial fibromatosis: Secondary | ICD-10-CM | POA: Diagnosis not present

## 2020-06-12 ENCOUNTER — Ambulatory Visit (INDEPENDENT_AMBULATORY_CARE_PROVIDER_SITE_OTHER): Payer: BC Managed Care – PPO | Admitting: Family Medicine

## 2020-06-12 ENCOUNTER — Encounter: Payer: Self-pay | Admitting: Family Medicine

## 2020-06-12 VITALS — BP 129/77 | HR 68

## 2020-06-12 DIAGNOSIS — J0110 Acute frontal sinusitis, unspecified: Secondary | ICD-10-CM

## 2020-06-12 MED ORDER — AMOXICILLIN 500 MG PO CAPS: 500.0000 mg | ORAL_CAPSULE | Freq: Two times a day (BID) | ORAL | 0 refills | Status: DC

## 2020-06-12 MED ORDER — FLUTICASONE PROPIONATE 50 MCG/ACT NA SUSP: 1.0000 | Freq: Two times a day (BID) | NASAL | 6 refills | Status: DC | PRN

## 2020-06-12 NOTE — Addendum Note (Signed)
Addended by: Margorie John on: 06/12/2020 04:39 PM   Modules accepted: Orders

## 2020-06-12 NOTE — Progress Notes (Signed)
BP 129/77   Pulse 68   SpO2 99%    Subjective:   Patient ID: Brandy Jefferson, female    DOB: 1975-12-31, 44 y.o.   MRN: [id number]  HPI: Brandy Jefferson is a 44 y.o. female presenting on 06/12/2020 for URI (cough, congestion. )   HPI Patient is here complaining of cough and congestion and sinus drainage and pressure that is been going on over the past week and a half.  She denies any fevers or chills or body aches.  She says she has been using Mucinex and other over-the-counter medicines and it does not seem to be helping her better and she is just still fighting it.  She denies any shortness of breath or wheezing.  Relevant past medical, surgical, family and social history reviewed and updated as indicated. Interim medical history since our last visit reviewed. Allergies and medications reviewed and updated.  Review of Systems  Constitutional: Negative for chills and fever.  HENT: Positive for congestion, postnasal drip, rhinorrhea, sinus pressure, sneezing and sore throat. Negative for ear discharge and ear pain.   Eyes: Negative for pain, redness and visual disturbance.  Respiratory: Positive for cough. Negative for chest tightness and shortness of breath.   Cardiovascular: Negative for chest pain and leg swelling.  Genitourinary: Negative for difficulty urinating and dysuria.  Musculoskeletal: Negative for back pain and gait problem.  Skin: Negative for rash.  Neurological: Negative for light-headedness and headaches.  Psychiatric/Behavioral: Negative for agitation and behavioral problems.  All other systems reviewed and are negative.   Per HPI unless specifically indicated above   Allergies as of 06/12/2020   No Known Allergies     Medication List       Accurate as of June 12, 2020  4:18 PM. If you have any questions, ask your nurse or doctor.        STOP taking these medications   phentermine 37.5 MG tablet Commonly known as: ADIPEX-P Stopped by: Elige Radon  Mussa Groesbeck, MD     TAKE these medications   amoxicillin 500 MG capsule Commonly known as: AMOXIL Take 1 capsule (500 mg total) by mouth 2 (two) times daily. Started by: Nils Pyle, MD   cyclobenzaprine 10 MG tablet Commonly known as: FLEXERIL Take 1 tablet (10 mg total) by mouth 3 (three) times daily as needed for muscle spasms.   diclofenac 75 MG EC tablet Commonly known as: VOLTAREN Take 75 mg by mouth twice daily   FLUoxetine 10 MG capsule Commonly known as: PROZAC TAKE 1 CAPSULE BY MOUTH EVERY DAY   fluticasone 50 MCG/ACT nasal spray Commonly known as: FLONASE Place 1 spray into both nostrils 2 (two) times daily as needed for allergies or rhinitis. Started by: Nils Pyle, MD   hydrochlorothiazide 25 MG tablet Commonly known as: HYDRODIURIL Take 1 tablet (25 mg total) by mouth daily.   levETIRAcetam 500 MG tablet Commonly known as: KEPPRA TAKE ONE TABLET IN THE MORNING AND TWO AT NIGHT. MUST SCHEDULE F/U TO RECEIVE FUTURE REFILLS.   norgestimate-ethinyl estradiol 0.25-35 MG-MCG tablet Commonly known as: ORTHO-CYCLEN Take 1 tablet by mouth daily.        Objective:   BP 129/77   Pulse 68   SpO2 99%   Wt Readings from Last 3 Encounters:  09/06/19 260 lb (117.9 kg)  02/02/19 254 lb 12.8 oz (115.6 kg)  12/06/18 258 lb 3.2 oz (117.1 kg)    Physical Exam Vitals and nursing note reviewed.  Constitutional:  General: She is not in acute distress.    Appearance: She is well-developed. She is not diaphoretic.  Eyes:     Conjunctiva/sclera: Conjunctivae normal.  Cardiovascular:     Rate and Rhythm: Normal rate and regular rhythm.     Heart sounds: Normal heart sounds. No murmur heard.   Pulmonary:     Effort: Pulmonary effort is normal. No respiratory distress.     Breath sounds: Normal breath sounds. No wheezing.  Musculoskeletal:        General: No tenderness. Normal range of motion.  Skin:    General: Skin is warm and dry.      Findings: No rash.  Neurological:     Mental Status: She is alert and oriented to person, place, and time.     Coordination: Coordination normal.  Psychiatric:        Behavior: Behavior normal.       Assessment & Plan:   Problem List Items Addressed This Visit    None    Visit Diagnoses    Acute non-recurrent frontal sinusitis    -  Primary   Relevant Medications   amoxicillin (AMOXIL) 500 MG capsule   fluticasone (FLONASE) 50 MCG/ACT nasal spray   Other Relevant Orders   Novel Coronavirus, NAA ([facility])    Going treatment sinus infection, quarantine until test results come back but does not appear like Covid  Follow up plan: Return if symptoms worsen or fail to improve.  Counseling provided for all of the vaccine components Orders Placed This Encounter  Procedures  . Novel Coronavirus, NAA ([facility])    Arville Care, MD [facility] 06/12/2020, 4:18 PM

## 2020-06-13 LAB — NOVEL CORONAVIRUS, NAA: SARS-CoV-2, NAA: NOT DETECTED

## 2020-06-13 LAB — SARS-COV-2, NAA 2 DAY TAT

## 2020-07-15 DIAGNOSIS — R0981 Nasal congestion: Secondary | ICD-10-CM | POA: Diagnosis not present

## 2020-07-15 DIAGNOSIS — R197 Diarrhea, unspecified: Secondary | ICD-10-CM | POA: Diagnosis not present

## 2020-07-15 DIAGNOSIS — B349 Viral infection, unspecified: Secondary | ICD-10-CM | POA: Diagnosis not present

## 2020-08-19 DIAGNOSIS — Z6841 Body Mass Index (BMI) 40.0 and over, adult: Secondary | ICD-10-CM | POA: Diagnosis not present

## 2020-08-19 DIAGNOSIS — Z1231 Encounter for screening mammogram for malignant neoplasm of breast: Secondary | ICD-10-CM | POA: Diagnosis not present

## 2020-08-19 DIAGNOSIS — Z01419 Encounter for gynecological examination (general) (routine) without abnormal findings: Secondary | ICD-10-CM | POA: Diagnosis not present

## 2020-09-05 ENCOUNTER — Other Ambulatory Visit: Payer: Self-pay | Admitting: *Deleted

## 2020-09-05 ENCOUNTER — Other Ambulatory Visit: Payer: Self-pay | Admitting: Family Medicine

## 2020-09-05 DIAGNOSIS — R601 Generalized edema: Secondary | ICD-10-CM

## 2020-10-04 ENCOUNTER — Other Ambulatory Visit: Payer: Self-pay | Admitting: *Deleted

## 2020-10-04 DIAGNOSIS — F320 Major depressive disorder, single episode, mild: Secondary | ICD-10-CM

## 2020-10-29 NOTE — Progress Notes (Signed)
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  Has history of seizures, on Keppra 500/1000 mg.  No recurrent seizures in around 5 years.  Continues to do well.  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  She is getting back on track with her follow-up appointments.  Presents today for evaluation unaccompanied.  Update 02/02/2019 SS: Brandy Jefferson is a 45 year old female with history of been arteriovenous malformation in the left temporal area that was ablated.  She has history of seizures and has done well taking Keppra 500 in the morning, 1000 mg in the evening. She is tolerating the medication well. Her last seizure was 3-4 years ago. Her overall health as been good. Her arthritis is acting up, especially in her right shoulder. She works in Water [employer]. She is driving a car without difficulty. She is on phentermine for weight loss.  She presents today for follow-up unaccompanied.  HISTORY 10/28/2017 Dr. Anne Hahn: Brandy Jefferson is a 45 year old right-handed white female with a history of an arteriovenous malformation in the left temporal area that was ablated, she has a history of seizures, she has done well on Keppra taking 500 mg in the morning and 1000 mg in the evening.  The patient tolerates the medication well without drowsiness or irritability.  She reports no seizures since last seen about 2 years ago.  The patient operates a motor vehicle without difficulty.  She recently had a cyst removed from her left shoulder but otherwise no new medical problems have been noted.  She returns to this office for an evaluation.  REVIEW OF SYSTEMS: Out of a complete 14 system review of symptoms, the patient complains only of the following symptoms, and all other reviewed systems are  negative.  Seizures  ALLERGIES: No Known Allergies  HOME MEDICATIONS: Outpatient Medications Prior to Visit  Medication Sig Dispense Refill  . cyclobenzaprine (FLEXERIL) 10 MG tablet Take 1 tablet (10 mg total) by mouth 3 (three) times daily as needed for muscle spasms. 60 tablet 0  . diclofenac (VOLTAREN) 75 MG EC tablet Take 75 mg by mouth twice daily  0  . FLUoxetine (PROZAC) 10 MG capsule TAKE 1 CAPSULE BY MOUTH EVERY DAY 90 capsule 3  . fluticasone (FLONASE) 50 MCG/ACT nasal spray Place 1 spray into both nostrils 2 (two) times daily as needed for allergies or rhinitis. 16 g 6  . hydrochlorothiazide (HYDRODIURIL) 25 MG tablet Take 1 tablet (25 mg total) by mouth daily. 90 tablet 3  . norgestimate-ethinyl estradiol (ORTHO-CYCLEN,SPRINTEC,PREVIFEM) 0.25-35 MG-MCG tablet Take 1 tablet by mouth daily.    Marland Kitchen levETIRAcetam (KEPPRA) 500 MG tablet TAKE ONE TABLET IN THE MORNING AND TWO AT NIGHT. MUST SCHEDULE F/U TO RECEIVE FUTURE REFILLS. 90 tablet 0  . amoxicillin (AMOXIL) 500 MG capsule Take 1 capsule (500 mg total) by mouth 2 (two) times daily. 20 capsule 0   No facility-administered medications prior to visit.    PAST MEDICAL HISTORY: Past Medical History:  Diagnosis Date  . Arteriovenous malformation of brain    Left temporal  . Partial seizure (HCC) 12/07/2012  . Plantar fasciitis    both feet  . Seizures (HCC)    Partial seizures, right hemisensory    PAST SURGICAL HISTORY: Past Surgical History:  Procedure Laterality Date  .  Arteriovenous malformation ablation  2004   Left temporal, intravascular procedure  . MASS EXCISION N/A 09/29/2017   Procedure: EXCISION SEBACEOUS 2CM CYST ON BACK;  Surgeon: [name], MD;  Location: [facility];  Service: General;  Laterality: N/A;    FAMILY HISTORY: Family History  Problem Relation Age of Onset  . Cancer Father     SOCIAL HISTORY: Social History   Socioeconomic History  . Marital status: Single    Spouse name: Not on  file  . Number of children: 1  . Years of education: 20  . Highest education level: Not on file  Occupational History    Employer: [employer]  Tobacco Use  . Smoking status: Never Smoker  . Smokeless tobacco: Never Used  Vaping Use  . Vaping Use: Never used  Substance and Sexual Activity  . Alcohol use: No    Comment: Occasional  . Drug use: No  . Sexual activity: Not on file  Other Topics Concern  . Not on file  Social History Narrative   Patient lives at home with daughter and her daughters dad.    Patient is not married.    Patient has 1 child.    Patient is currently working.    Patient drinks about 5 cups of caffeine daily   Patient is right handed.      Social Determinants of Health   Financial Resource Strain: Not on file  Food Insecurity: Not on file  Transportation Needs: Not on file  Physical Activity: Not on file  Stress: Not on file  Social Connections: Not on file  Intimate Partner Violence: Not on file   PHYSICAL EXAM  Vitals:   10/30/20 1012  BP: (!) 132/58  Pulse: 66  Weight: 258 lb (117 kg)  Height: 5\' 4"  (1.626 m)   Body mass index is 44.29 kg/m.  Generalized: Well developed, in no acute distress  Neurological examination  Mentation: Alert oriented to time, place, history taking. Follows all commands speech and language fluent Cranial nerve II-XII: Pupils were equal round reactive to light. Extraocular movements were full, visual field were full on confrontational test. Facial sensation and strength were normal.  Head turning and shoulder shrug  were normal and symmetric. Motor: The motor testing reveals 5 over 5 strength of all 4 extremities, limited ROM right shoulder pain.  Good symmetric motor tone is noted throughout.  Sensory: Sensory testing is intact to soft touch on all 4 extremities. No evidence of extinction is noted.  Coordination: Cerebellar testing reveals good finger-nose-finger and heel-to-shin bilaterally.  Gait and  station: Gait is normal.  Reflexes: Deep tendon reflexes are symmetric and normal bilaterally.   DIAGNOSTIC DATA (LABS, IMAGING, TESTING) - I reviewed patient records, labs, notes, testing and imaging myself where available.  Lab Results  Component Value Date   WBC 7.1 09/21/2017   HGB 13.0 09/21/2017   HCT 41.7 09/21/2017   MCV 91.6 09/21/2017   PLT 370 09/21/2017      Component Value Date/Time   NA 139 09/21/2017 1504   K 3.7 09/21/2017 1504   CL 107 09/21/2017 1504   CO2 23 09/21/2017 1504   GLUCOSE 96 09/21/2017 1504   BUN 15 09/21/2017 1504   CREATININE 0.65 09/21/2017 1504   CALCIUM 8.5 (L) 09/21/2017 1504   GFRNONAA >60 09/21/2017 1504   GFRAA >60 09/21/2017 1504   No results found for: CHOL, HDL, LDLCALC, LDLDIRECT, TRIG, CHOLHDL No results found for: 11/21/2017 No results found for: VITAMINB12 No  results found for: TSH  ASSESSMENT AND PLAN 45 y.o. year old female  has a past medical history of Arteriovenous malformation of brain, Partial seizure (HCC) (12/07/2012), Plantar fasciitis, and Seizures (HCC). here with:  1.  History of seizures, well controlled  -No recurrent seizures in around 5 years -Continue Keppra 500 mg AM/1000 mg PM, refill sent in  -Call for seizure activity, otherwise follow-up 1 year or sooner if needed  I spent 20 minutes of face-to-face and non-face-to-face time with patient.  This included previsit chart review, lab review, study review, order entry, electronic health record documentation, patient education.  Margie Ege, AGNP-C, DNP 10/30/2020, 10:31 AM [facility] [address], [address] [place], [place] [address] [phone]

## 2020-10-30 ENCOUNTER — Ambulatory Visit: Payer: BC Managed Care – PPO | Admitting: Neurology

## 2020-10-30 ENCOUNTER — Other Ambulatory Visit: Payer: Self-pay

## 2020-10-30 ENCOUNTER — Encounter: Payer: Self-pay | Admitting: Neurology

## 2020-10-30 VITALS — BP 132/58 | HR 66 | Ht 64.0 in | Wt 258.0 lb

## 2020-10-30 DIAGNOSIS — R569 Unspecified convulsions: Secondary | ICD-10-CM | POA: Diagnosis not present

## 2020-10-30 MED ORDER — LEVETIRACETAM 500 MG PO TABS: ORAL_TABLET | ORAL | 4 refills | Status: DC

## 2020-10-30 NOTE — Progress Notes (Signed)
I have read the note, and I agree with the clinical assessment and plan.  Rodriques Badie K Javia Dillow   

## 2020-10-30 NOTE — Patient Instructions (Signed)
Continue Keppra at current dosing Call for seizures See you back in 1 year  

## 2020-11-01 ENCOUNTER — Telehealth: Payer: Self-pay | Admitting: Neurology

## 2020-11-01 NOTE — Telephone Encounter (Signed)
[address] [facility]) called, we received some records from your office. Pt has never been seen at our office, Aniceto Boss is a PA here. Want to notify you she has not been seen at this office.  Contact info: [phone]

## 2020-11-04 NOTE — Telephone Encounter (Signed)
I called and LMVM for pt to return call asking who her pcp is.  Prudy Feeler PA [phone], [facility].

## 2020-11-04 NOTE — Telephone Encounter (Signed)
I called and spoke to [facility] and they do not have pt in system.  Prudy Feeler PA is with them but pt is not.  I see that she is not listed as pcp now in Epic.  I have not heard back from pt.

## 2020-11-06 ENCOUNTER — Ambulatory Visit (INDEPENDENT_AMBULATORY_CARE_PROVIDER_SITE_OTHER): Payer: BC Managed Care – PPO | Admitting: Family Medicine

## 2020-11-06 ENCOUNTER — Encounter: Payer: Self-pay | Admitting: Family Medicine

## 2020-11-06 ENCOUNTER — Other Ambulatory Visit: Payer: Self-pay

## 2020-11-06 DIAGNOSIS — F339 Major depressive disorder, recurrent, unspecified: Secondary | ICD-10-CM

## 2020-11-06 DIAGNOSIS — R601 Generalized edema: Secondary | ICD-10-CM

## 2020-11-06 DIAGNOSIS — Z1159 Encounter for screening for other viral diseases: Secondary | ICD-10-CM | POA: Diagnosis not present

## 2020-11-06 DIAGNOSIS — F411 Generalized anxiety disorder: Secondary | ICD-10-CM

## 2020-11-06 DIAGNOSIS — Z23 Encounter for immunization: Secondary | ICD-10-CM

## 2020-11-06 DIAGNOSIS — R569 Unspecified convulsions: Secondary | ICD-10-CM

## 2020-11-06 MED ORDER — CITALOPRAM HYDROBROMIDE 20 MG PO TABS: 20.0000 mg | ORAL_TABLET | Freq: Every day | ORAL | 5 refills | Status: DC

## 2020-11-06 MED ORDER — HYDROCHLOROTHIAZIDE 25 MG PO TABS: 25.0000 mg | ORAL_TABLET | Freq: Every day | ORAL | 1 refills | Status: DC

## 2020-11-06 MED ORDER — PHENTERMINE HCL 37.5 MG PO TABS: 37.5000 mg | ORAL_TABLET | Freq: Every day | ORAL | 0 refills | Status: DC

## 2020-11-06 NOTE — Addendum Note (Signed)
Addended by: Julious Payer D on: 11/06/2020 03:14 PM   Modules accepted: Orders

## 2020-11-06 NOTE — Progress Notes (Signed)
Established Patient Office Visit  Subjective:  Patient ID: Brandy Jefferson, female    DOB: 12/02/1975  Age: 45 y.o. MRN: [id number]  CC:  Chief Complaint  Patient presents with  . Medical Management of Chronic Issues    HPI Brandy Jefferson presents for chronic follow up.  1. Obesity She reports that her diet is not good. She is active throughout the day at work and with her grandson. She does not exercise outside of that. Brandy Jefferson used to take phentermine, she is interested in trying this again. She reports that she did well on this without side effects.   2. Depression and anxiety She has been on prozac for a few years. She reports that has never really been helpful. Denies SI.   Depression screen [facility] 2/9 11/06/2020 09/23/2018 06/22/2018  Decreased Interest 1 1 2   Down, Depressed, Hopeless 1 1 2   PHQ - 2 Score 2 2 4   Altered sleeping 0 1 2  Tired, decreased energy 2 1 2   Change in appetite 1 1 2   Feeling bad or failure about yourself  1 1 2   Trouble concentrating 2 1 1   Moving slowly or fidgety/restless 0 0 1  Suicidal thoughts 0 0 1  PHQ-9 Score 8 7 -  Difficult doing work/chores Somewhat difficult - -   GAD 7 : Generalized Anxiety Score 11/06/2020  Nervous, Anxious, on Edge 0  Control/stop worrying 0  Worry too much - different things 1  Trouble relaxing 3  Restless 0  Easily annoyed or irritable 1  Afraid - awful might happen 0  Total GAD 7 Score 5  Anxiety Difficulty Somewhat difficult      Past Medical History:  Diagnosis Date  . Arteriovenous malformation of brain    Left temporal  . Partial seizure ([place]) 12/07/2012  . Plantar fasciitis    both feet  . Seizures ([place])    Partial seizures, right hemisensory    Past Surgical History:  Procedure Laterality Date  . Arteriovenous malformation ablation  2004   Left temporal, intravascular procedure  . MASS EXCISION N/A 09/29/2017   Procedure: EXCISION SEBACEOUS 2CM CYST ON BACK;  Surgeon: Virl Cagey, MD;  Location: [facility];  Service: General;  Laterality: N/A;    Family History  Problem Relation Age of Onset  . Cancer Father     Social History   Socioeconomic History  . Marital status: Single    Spouse name: Not on file  . Number of children: 1  . Years of education: 53  . Highest education level: Not on file  Occupational History    Employer: [employer]  Tobacco Use  . Smoking status: Never Smoker  . Smokeless tobacco: Never Used  Vaping Use  . Vaping Use: Never used  Substance and Sexual Activity  . Alcohol use: No    Comment: Occasional  . Drug use: No  . Sexual activity: Not on file  Other Topics Concern  . Not on file  Social History Narrative   Patient lives at home with daughter and her daughters dad.    Patient is not married.    Patient has 1 child.    Patient is currently working.    Patient drinks about 5 cups of caffeine daily   Patient is right handed.      Social Determinants of Health   Financial Resource Strain: Not on file  Food Insecurity: Not on file  Transportation Needs: Not on file  Physical Activity:  Not on file  Stress: Not on file  Social Connections: Not on file  Intimate Partner Violence: Not on file    Outpatient Medications Prior to Visit  Medication Sig Dispense Refill  . cyclobenzaprine (FLEXERIL) 10 MG tablet Take 1 tablet (10 mg total) by mouth 3 (three) times daily as needed for muscle spasms. 60 tablet 0  . diclofenac (VOLTAREN) 75 MG EC tablet Take 75 mg by mouth twice daily  0  . FLUoxetine (PROZAC) 10 MG capsule TAKE 1 CAPSULE BY MOUTH EVERY DAY 90 capsule 3  . hydrochlorothiazide (HYDRODIURIL) 25 MG tablet Take 1 tablet (25 mg total) by mouth daily. 90 tablet 3  . levETIRAcetam (KEPPRA) 500 MG tablet Take 1 tablet in the morning, take 2 at night 270 tablet 4  . norgestimate-ethinyl estradiol (ORTHO-CYCLEN,SPRINTEC,PREVIFEM) 0.25-35 MG-MCG tablet Take 1 tablet by mouth daily.    . fluticasone (FLONASE)  50 MCG/ACT nasal spray Place 1 spray into both nostrils 2 (two) times daily as needed for allergies or rhinitis. 16 g 6   No facility-administered medications prior to visit.    No Known Allergies  ROS Review of Systems As per HPI.    Objective:    Physical Exam Vitals and nursing note reviewed.  Constitutional:      General: She is not in acute distress.    Appearance: She is obese. She is not ill-appearing, toxic-appearing or diaphoretic.  HENT:     Head: Normocephalic and atraumatic.  Cardiovascular:     Rate and Rhythm: Normal rate and regular rhythm.     Heart sounds: Normal heart sounds. No murmur heard.   Pulmonary:     Effort: Pulmonary effort is normal. No respiratory distress.     Breath sounds: Normal breath sounds.  Abdominal:     General: There is no distension.     Palpations: Abdomen is soft.     Tenderness: There is no abdominal tenderness. There is no guarding or rebound.  Musculoskeletal:     Right lower leg: No edema.     Left lower leg: No edema.  Skin:    General: Skin is warm and dry.  Neurological:     General: No focal deficit present.     Mental Status: She is alert and oriented to person, place, and time.  Psychiatric:        Mood and Affect: Mood normal.        Behavior: Behavior normal.        Thought Content: Thought content normal.        Judgment: Judgment normal.     BP 116/65   Pulse 69   Temp 98.3 F (36.8 C) (Temporal)   Ht 5' 4"  (1.626 m)   Wt 256 lb 2 oz (116.2 kg)   BMI 43.96 kg/m  Wt Readings from Last 3 Encounters:  11/06/20 256 lb 2 oz (116.2 kg)  10/30/20 258 lb (117 kg)  09/06/19 260 lb (117.9 kg)     Health Maintenance Due  Topic Date Due  . Hepatitis C Screening  Never done  . HIV Screening  Never done  . TETANUS/TDAP  08/05/2002  . COLONOSCOPY (Pts 45-57yr Insurance coverage will need to be confirmed)  Never done    There are no preventive care reminders to display for this patient.  No results  found for: TSH Lab Results  Component Value Date   WBC 7.1 09/21/2017   HGB 13.0 09/21/2017   HCT 41.7 09/21/2017   MCV 91.6 09/21/2017  PLT 370 09/21/2017   Lab Results  Component Value Date   NA 139 09/21/2017   K 3.7 09/21/2017   CO2 23 09/21/2017   GLUCOSE 96 09/21/2017   BUN 15 09/21/2017   CREATININE 0.65 09/21/2017   CALCIUM 8.5 (L) 09/21/2017   ANIONGAP 9 09/21/2017   No results found for: CHOL No results found for: HDL No results found for: LDLCALC No results found for: TRIG No results found for: CHOLHDL No results found for: HGBA1C    Assessment & Plan:   [place] was seen today for medical management of chronic issues.  Diagnoses and all orders for this visit:  Morbid obesity ([place]) Restart phentermine as below. Labs pending- patient did have small piece of chocolate shortly before the visit today.  -     CBC with Differential/Platelet -     CMP14+EGFR -     Lipid panel -     Thyroid Panel With TSH -     phentermine (ADIPEX-P) 37.5 MG tablet; Take 1 tablet (37.5 mg total) by mouth daily before breakfast.  Recurrent depression ([place]) Uncontrolled. Switch to celexa as below. -     citalopram (CELEXA) 20 MG tablet; Take 1 tablet (20 mg total) by mouth daily.  Generalized anxiety disorder Uncontrolled. Switch to celexa as below.  -     citalopram (CELEXA) 20 MG tablet; Take 1 tablet (20 mg total) by mouth daily.  Partial seizure [facility]) Managed by neurology, last appointment was 1 week ago. On keppra. Reports no seizures in last 5 years.    Need for hepatitis C screening test -     Hepatitis C antibody  Need for vaccination Vaccine today in office.  -     Tdap vaccine greater than or equal to 7yo IM   Follow-up: Return in about 4 weeks (around 12/04/2020) for weight management, depression.   The patient indicates understanding of these issues and agrees with the plan.  Gwenlyn Perking, FNP

## 2020-11-06 NOTE — Patient Instructions (Signed)

## 2020-11-07 LAB — LIPID PANEL
Chol/HDL Ratio: 2.7 ratio (ref 0.0–4.4)
Cholesterol, Total: 223 mg/dL — ABNORMAL HIGH (ref 100–199)
HDL: 82 mg/dL (ref >39)
LDL Chol Calc (NIH): 129 mg/dL — ABNORMAL HIGH (ref 0–99)
Triglycerides: 71 mg/dL (ref 0–149)
VLDL Cholesterol Cal: 12 mg/dL (ref 5–40)

## 2020-11-07 LAB — CBC WITH DIFFERENTIAL/PLATELET
Basophils Absolute: 0.1 10*3/uL (ref 0.0–0.2)
Basos: 1 %
EOS (ABSOLUTE): 0.2 10*3/uL (ref 0.0–0.4)
Eos: 2 %
Hematocrit: 43.5 % (ref 34.0–46.6)
Hemoglobin: 14.4 g/dL (ref 11.1–15.9)
Immature Grans (Abs): 0 10*3/uL (ref 0.0–0.1)
Immature Granulocytes: 0 %
Lymphocytes Absolute: 2.1 10*3/uL (ref 0.7–3.1)
Lymphs: 25 %
MCH: 29 pg (ref 26.6–33.0)
MCHC: 33.1 g/dL (ref 31.5–35.7)
MCV: 88 fL (ref 79–97)
Monocytes Absolute: 0.7 10*3/uL (ref 0.1–0.9)
Monocytes: 8 %
Neutrophils Absolute: 5.3 10*3/uL (ref 1.4–7.0)
Neutrophils: 64 %
Platelets: 427 10*3/uL (ref 150–450)
RBC: 4.96 x10E6/uL (ref 3.77–5.28)
RDW: 12.2 % (ref 11.7–15.4)
WBC: 8.3 10*3/uL (ref 3.4–10.8)

## 2020-11-07 LAB — CMP14+EGFR
ALT: 15 IU/L (ref 0–32)
AST: 18 IU/L (ref 0–40)
Albumin/Globulin Ratio: 1.7 (ref 1.2–2.2)
Albumin: 4.3 g/dL (ref 3.8–4.8)
Alkaline Phosphatase: 63 IU/L (ref 44–121)
BUN/Creatinine Ratio: 18 (ref 9–23)
BUN: 14 mg/dL (ref 6–24)
Bilirubin Total: 0.3 mg/dL (ref 0.0–1.2)
CO2: 22 mmol/L (ref 20–29)
Calcium: 9.3 mg/dL (ref 8.7–10.2)
Chloride: 101 mmol/L (ref 96–106)
Creatinine, Ser: 0.8 mg/dL (ref 0.57–1.00)
Globulin, Total: 2.5 g/dL (ref 1.5–4.5)
Glucose: 79 mg/dL (ref 65–99)
Potassium: 4.6 mmol/L (ref 3.5–5.2)
Sodium: 139 mmol/L (ref 134–144)
Total Protein: 6.8 g/dL (ref 6.0–8.5)
eGFR: 93 mL/min/{1.73_m2} (ref >59)

## 2020-11-07 LAB — THYROID PANEL WITH TSH
Free Thyroxine Index: 1.8 (ref 1.2–4.9)
T3 Uptake Ratio: 22 % — ABNORMAL LOW (ref 24–39)
T4, Total: 8.1 ug/dL (ref 4.5–12.0)
TSH: 2.47 u[IU]/mL (ref 0.450–4.500)

## 2020-11-07 LAB — HEPATITIS C ANTIBODY: Hep C Virus Ab: <0.1 s/co ratio (ref 0.0–0.9)

## 2020-11-28 ENCOUNTER — Other Ambulatory Visit: Payer: Self-pay | Admitting: Family Medicine

## 2020-11-28 DIAGNOSIS — F339 Major depressive disorder, recurrent, unspecified: Secondary | ICD-10-CM

## 2020-11-28 DIAGNOSIS — F411 Generalized anxiety disorder: Secondary | ICD-10-CM

## 2021-01-14 ENCOUNTER — Other Ambulatory Visit: Payer: Self-pay | Admitting: Family Medicine

## 2021-01-31 ENCOUNTER — Other Ambulatory Visit: Payer: Self-pay | Admitting: Family Medicine

## 2021-01-31 ENCOUNTER — Encounter: Payer: Self-pay | Admitting: Family Medicine

## 2021-01-31 ENCOUNTER — Other Ambulatory Visit: Payer: Self-pay

## 2021-01-31 ENCOUNTER — Ambulatory Visit: Payer: BC Managed Care – PPO | Admitting: Family Medicine

## 2021-01-31 DIAGNOSIS — G8929 Other chronic pain: Secondary | ICD-10-CM

## 2021-01-31 DIAGNOSIS — M25511 Pain in right shoulder: Secondary | ICD-10-CM

## 2021-01-31 DIAGNOSIS — F339 Major depressive disorder, recurrent, unspecified: Secondary | ICD-10-CM | POA: Diagnosis not present

## 2021-01-31 DIAGNOSIS — F411 Generalized anxiety disorder: Secondary | ICD-10-CM

## 2021-01-31 MED ORDER — PHENTERMINE HCL 37.5 MG PO TABS: 37.5000 mg | ORAL_TABLET | Freq: Every day | ORAL | 0 refills | Status: DC

## 2021-01-31 MED ORDER — CYCLOBENZAPRINE HCL 10 MG PO TABS: 10.0000 mg | ORAL_TABLET | Freq: Three times a day (TID) | ORAL | 0 refills | Status: DC | PRN

## 2021-01-31 MED ORDER — CITALOPRAM HYDROBROMIDE 40 MG PO TABS: 40.0000 mg | ORAL_TABLET | Freq: Every day | ORAL | 3 refills | Status: DC

## 2021-01-31 NOTE — Patient Instructions (Signed)

## 2021-01-31 NOTE — Progress Notes (Signed)
Established Patient Office Visit  Subjective:  Patient ID: Brandy Jefferson, female    DOB: 02-Apr-1976  Age: 45 y.o. MRN: [id number]  CC:  Chief Complaint  Patient presents with   Medication Refill    HPI [name] presents for weight management.   Obesity Amere was trying phentermine a few months. She denies side effects with it but didn't feel like she was seeing results. She would like to try this again. She reports eating a well balanced diet. She had struggled to exercise lately due to the heat.   2. Depression/anxiety Reports increase in Celexa is helping. Would like to increase again if possible.   3. Chronic shoulder pain She would like a refill on flexeril. She takes this prn for chronic right shoulder pain. This feels like a muscle tightness, usually worse with increased stress.   Depression screen [facility] 2/9 01/31/2021 11/06/2020 09/23/2018  Decreased Interest 1 1 1   Down, Depressed, Hopeless 1 1 1   PHQ - 2 Score 2 2 2   Altered sleeping 1 0 1  Tired, decreased energy 0 2 1  Change in appetite 0 1 1  Feeling bad or failure about yourself  0 1 1  Trouble concentrating 0 2 1  Moving slowly or fidgety/restless 0 0 0  Suicidal thoughts 0 0 0  PHQ-9 Score 3 8 7   Difficult doing work/chores Not difficult at all Somewhat difficult -   GAD 7 : Generalized Anxiety Score 01/31/2021 11/06/2020  Nervous, Anxious, on Edge 0 0  Control/stop worrying 0 0  Worry too much - different things 1 1  Trouble relaxing 0 3  Restless 0 0  Easily annoyed or irritable 1 1  Afraid - awful might happen 0 0  Total GAD 7 Score 2 5  Anxiety Difficulty Not difficult at all Somewhat difficult      Past Medical History:  Diagnosis Date   Arteriovenous malformation of brain    Left temporal   Partial seizure ([place]) 12/07/2012   Plantar fasciitis    both feet   Seizures (HCC)    Partial seizures, right hemisensory    Past Surgical History:  Procedure Laterality Date    Arteriovenous malformation ablation  2004   Left temporal, intravascular procedure   MASS EXCISION N/A 09/29/2017   Procedure: EXCISION SEBACEOUS 2CM CYST ON BACK;  Surgeon: Virl Cagey, MD;  Location: [facility];  Service: General;  Laterality: N/A;    Family History  Problem Relation Age of Onset   Cancer Father     Social History   Socioeconomic History   Marital status: Single    Spouse name: Not on file   Number of children: 1   Years of education: 12   Highest education level: Not on file  Occupational History    Employer: [employer]  Tobacco Use   Smoking status: Never   Smokeless tobacco: Never  Vaping Use   Vaping Use: Never used  Substance and Sexual Activity   Alcohol use: No    Comment: Occasional   Drug use: No   Sexual activity: Not on file  Other Topics Concern   Not on file  Social History Narrative   Patient lives at home with daughter and her daughters dad.    Patient is not married.    Patient has 1 child.    Patient is currently working.    Patient drinks about 5 cups of caffeine daily   Patient is right handed.  Social Determinants of Health   Financial Resource Strain: Not on file  Food Insecurity: Not on file  Transportation Needs: Not on file  Physical Activity: Not on file  Stress: Not on file  Social Connections: Not on file  Intimate Partner Violence: Not on file    Outpatient Medications Prior to Visit  Medication Sig Dispense Refill   citalopram (CELEXA) 20 MG tablet TAKE 1 TABLET BY MOUTH EVERY DAY 90 tablet 0   cyclobenzaprine (FLEXERIL) 10 MG tablet Take 1 tablet (10 mg total) by mouth 3 (three) times daily as needed for muscle spasms. 60 tablet 0   diclofenac (VOLTAREN) 75 MG EC tablet Take 75 mg by mouth twice daily  0   hydrochlorothiazide (HYDRODIURIL) 25 MG tablet Take 1 tablet (25 mg total) by mouth daily. 90 tablet 1   levETIRAcetam (KEPPRA) 500 MG tablet Take 1 tablet in the morning, take 2 at night 270  tablet 4   norgestimate-ethinyl estradiol (ORTHO-CYCLEN,SPRINTEC,PREVIFEM) 0.25-35 MG-MCG tablet Take 1 tablet by mouth daily.     phentermine (ADIPEX-P) 37.5 MG tablet Take 1 tablet (37.5 mg total) by mouth daily before breakfast. 30 tablet 0   No facility-administered medications prior to visit.    No Known Allergies  ROS Review of Systems As per HPI.    Objective:    Physical Exam Vitals and nursing note reviewed.  Constitutional:      General: She is not in acute distress.    Appearance: She is obese. She is not ill-appearing, toxic-appearing or diaphoretic.  Cardiovascular:     Rate and Rhythm: Normal rate and regular rhythm.     Heart sounds: Normal heart sounds. No murmur heard. Pulmonary:     Effort: Pulmonary effort is normal. No respiratory distress.     Breath sounds: Normal breath sounds.  Musculoskeletal:     Right lower leg: No edema.     Left lower leg: No edema.  Skin:    General: Skin is warm and dry.  Neurological:     General: No focal deficit present.     Mental Status: She is alert and oriented to person, place, and time.  Psychiatric:        Mood and Affect: Mood normal.        Behavior: Behavior normal.    BP 104/70   Pulse 71   Temp 97.9 F (36.6 C)   Ht 5' 4"  (1.626 m)   Wt 253 lb (114.8 kg)   SpO2 99%   BMI 43.43 kg/m  Wt Readings from Last 3 Encounters:  01/31/21 253 lb (114.8 kg)  11/06/20 256 lb 2 oz (116.2 kg)  10/30/20 258 lb (117 kg)     Health Maintenance Due  Topic Date Due   HIV Screening  Never done   COLONOSCOPY (Pts 45-62yr Insurance coverage will need to be confirmed)  Never done    There are no preventive care reminders to display for this patient.  Lab Results  Component Value Date   TSH 2.470 11/06/2020   Lab Results  Component Value Date   WBC 8.3 11/06/2020   HGB 14.4 11/06/2020   HCT 43.5 11/06/2020   MCV 88 11/06/2020   PLT 427 11/06/2020   Lab Results  Component Value Date   NA 139 11/06/2020    K 4.6 11/06/2020   CO2 22 11/06/2020   GLUCOSE 79 11/06/2020   BUN 14 11/06/2020   CREATININE 0.80 11/06/2020   BILITOT 0.3 11/06/2020   ALKPHOS 63 11/06/2020  AST 18 11/06/2020   ALT 15 11/06/2020   PROT 6.8 11/06/2020   ALBUMIN 4.3 11/06/2020   CALCIUM 9.3 11/06/2020   ANIONGAP 9 09/21/2017   EGFR 93 11/06/2020   Lab Results  Component Value Date   CHOL 223 (H) 11/06/2020   Lab Results  Component Value Date   HDL 82 11/06/2020   Lab Results  Component Value Date   LDLCALC 129 (H) 11/06/2020   Lab Results  Component Value Date   TRIG 71 11/06/2020   Lab Results  Component Value Date   CHOLHDL 2.7 11/06/2020   No results found for: HGBA1C    Assessment & Plan:   Yizel was seen today for medication refill.  Diagnoses and all orders for this visit:  Morbid obesity ([place]) PDMP reviewed, no red flags. Phentermine ordered. Diet and exercise. Discussed Wegovy or Saxenda if no results with phentermine and if covered by insurance.  -     phentermine (ADIPEX-P) 37.5 MG tablet; Take 1 tablet (37.5 mg total) by mouth daily before breakfast.  Recurrent depression (HCC) Increase Celexa as below.  -     citalopram (CELEXA) 40 MG tablet; Take 1 tablet (40 mg total) by mouth daily.  Generalized anxiety disorder Increase Ceelxa as below.  -     citalopram (CELEXA) 40 MG tablet; Take 1 tablet (40 mg total) by mouth daily.  Chronic right shoulder pain Refill provided.  -     cyclobenzaprine (FLEXERIL) 10 MG tablet; Take 1 tablet (10 mg total) by mouth 3 (three) times daily as needed for muscle spasms.   Follow-up: Return in about 4 weeks (around 02/28/2021) for weight management.   The patient indicates understanding of these issues and agrees with the plan.  Gwenlyn Perking, FNP

## 2021-02-25 DIAGNOSIS — M79672 Pain in left foot: Secondary | ICD-10-CM | POA: Diagnosis not present

## 2021-02-25 DIAGNOSIS — M76822 Posterior tibial tendinitis, left leg: Secondary | ICD-10-CM | POA: Diagnosis not present

## 2021-02-28 ENCOUNTER — Encounter: Payer: Self-pay | Admitting: Family Medicine

## 2021-02-28 ENCOUNTER — Other Ambulatory Visit: Payer: Self-pay

## 2021-02-28 ENCOUNTER — Ambulatory Visit: Payer: BC Managed Care – PPO | Admitting: Family Medicine

## 2021-02-28 DIAGNOSIS — F411 Generalized anxiety disorder: Secondary | ICD-10-CM | POA: Diagnosis not present

## 2021-02-28 DIAGNOSIS — F330 Major depressive disorder, recurrent, mild: Secondary | ICD-10-CM | POA: Diagnosis not present

## 2021-02-28 MED ORDER — PHENTERMINE HCL 37.5 MG PO TABS: 37.5000 mg | ORAL_TABLET | Freq: Every day | ORAL | 0 refills | Status: DC

## 2021-02-28 NOTE — Patient Instructions (Signed)

## 2021-02-28 NOTE — Progress Notes (Signed)
 Established Patient Office Visit  Subjective:  Patient ID: Brandy Jefferson, female    DOB: 12/06/1975  Age: 45 y.o. MRN: [id number]  CC:  Chief Complaint  Patient presents with   Obesity    HPI Brandy Jefferson presents for weight management follow up.   Obesity Reports doing well on phentermine without side effects. She has lost 2 pounds since her last visit. She has been maintaining a healthy low carb diet. She has not been exercising much due to the heat and time constraints. She has been very busy at work and then cares for her young grandson when she gets off work.   2. Anxiety/depression She reports that the increased Celexa dosage has been helpful. She feels stressed today due to some family conflicts.   Depression screen PHQ 2/9 02/28/2021 01/31/2021 11/06/2020  Decreased Interest 1 1 1  Down, Depressed, Hopeless 1 1 1  PHQ - 2 Score 2 2 2  Altered sleeping 2 1 0  Tired, decreased energy 1 0 2  Change in appetite 0 0 1  Feeling bad or failure about yourself  0 0 1  Trouble concentrating 0 0 2  Moving slowly or fidgety/restless 0 0 0  Suicidal thoughts 0 0 0  PHQ-9 Score 5 3 8  Difficult doing work/chores Not difficult at all Not difficult at all Somewhat difficult   GAD 7 : Generalized Anxiety Score 02/28/2021 01/31/2021 11/06/2020  Nervous, Anxious, on Edge 1 0 0  Control/stop worrying 1 0 0  Worry too much - different things 2 1 1  Trouble relaxing 1 0 3  Restless 1 0 0  Easily annoyed or irritable 1 1 1  Afraid - awful might happen 1 0 0  Total GAD 7 Score 8 2 5  Anxiety Difficulty Somewhat difficult Not difficult at all Somewhat difficult        Past Medical History:  Diagnosis Date   Arteriovenous malformation of brain    Left temporal   Partial seizure (HCC) 12/07/2012   Plantar fasciitis    both feet   Seizures (HCC)    Partial seizures, right hemisensory    Past Surgical History:  Procedure Laterality Date   Arteriovenous malformation  ablation  2004   Left temporal, intravascular procedure   MASS EXCISION N/A 09/29/2017   Procedure: EXCISION SEBACEOUS 2CM CYST ON BACK;  Surgeon: Bridges, Lindsay C, MD;  Location: [facility];  Service: General;  Laterality: N/A;    Family History  Problem Relation Age of Onset   Cancer Father     Social History   Socioeconomic History   Marital status: Single    Spouse name: Not on file   Number of children: 1   Years of education: 12   Highest education level: Not on file  Occupational History    Employer: [employer]  Tobacco Use   Smoking status: Never   Smokeless tobacco: Never  Vaping Use   Vaping Use: Never used  Substance and Sexual Activity   Alcohol use: No    Comment: Occasional   Drug use: No   Sexual activity: Not on file  Other Topics Concern   Not on file  Social History Narrative   Patient lives at home with daughter and her daughters dad.    Patient is not married.    Patient has 1 child.    Patient is currently working.    Patient drinks about 5 cups of caffeine daily   Patient is right   handed.      Social Determinants of Health   Financial Resource Strain: Not on file  Food Insecurity: Not on file  Transportation Needs: Not on file  Physical Activity: Not on file  Stress: Not on file  Social Connections: Not on file  Intimate Partner Violence: Not on file    Outpatient Medications Prior to Visit  Medication Sig Dispense Refill   citalopram (CELEXA) 40 MG tablet Take 1 tablet (40 mg total) by mouth daily. 90 tablet 3   cyclobenzaprine (FLEXERIL) 10 MG tablet Take 1 tablet (10 mg total) by mouth 3 (three) times daily as needed for muscle spasms. 60 tablet 0   diclofenac (VOLTAREN) 75 MG EC tablet Take 75 mg by mouth twice daily  0   hydrochlorothiazide (HYDRODIURIL) 25 MG tablet Take 1 tablet (25 mg total) by mouth daily. 90 tablet 1   levETIRAcetam (KEPPRA) 500 MG tablet Take 1 tablet in the morning, take 2 at night 270 tablet 4    methylPREDNISolone (MEDROL DOSEPAK) 4 MG TBPK tablet Take by mouth as directed.     norgestimate-ethinyl estradiol (ORTHO-CYCLEN,SPRINTEC,PREVIFEM) 0.25-35 MG-MCG tablet Take 1 tablet by mouth daily.     phentermine (ADIPEX-P) 37.5 MG tablet Take 1 tablet (37.5 mg total) by mouth daily before breakfast. 30 tablet 0   No facility-administered medications prior to visit.    No Known Allergies  ROS Review of Systems As per HPI.    Objective:    Physical Exam Vitals and nursing note reviewed.  Constitutional:      General: She is not in acute distress.    Appearance: She is not ill-appearing, toxic-appearing or diaphoretic.  Cardiovascular:     Rate and Rhythm: Normal rate and regular rhythm.     Heart sounds: Normal heart sounds. No murmur heard. Pulmonary:     Effort: Pulmonary effort is normal.     Breath sounds: Normal breath sounds.  Musculoskeletal:     Right lower leg: No edema.     Left lower leg: No edema.  Skin:    General: Skin is warm and dry.  Neurological:     General: No focal deficit present.     Mental Status: She is alert and oriented to person, place, and time.  Psychiatric:        Behavior: Behavior normal.    Temp 98.1 F (36.7 C) (Temporal)   Ht 5' 4" (1.626 m)   Wt 251 lb (113.9 kg)   BMI 43.08 kg/m  Wt Readings from Last 3 Encounters:  02/28/21 251 lb (113.9 kg)  01/31/21 253 lb (114.8 kg)  11/06/20 256 lb 2 oz (116.2 kg)     There are no preventive care reminders to display for this patient.  There are no preventive care reminders to display for this patient.  Lab Results  Component Value Date   TSH 2.470 11/06/2020   Lab Results  Component Value Date   WBC 8.3 11/06/2020   HGB 14.4 11/06/2020   HCT 43.5 11/06/2020   MCV 88 11/06/2020   PLT 427 11/06/2020   Lab Results  Component Value Date   NA 139 11/06/2020   K 4.6 11/06/2020   CO2 22 11/06/2020   GLUCOSE 79 11/06/2020   BUN 14 11/06/2020   CREATININE 0.80 11/06/2020    BILITOT 0.3 11/06/2020   ALKPHOS 63 11/06/2020   AST 18 11/06/2020   ALT 15 11/06/2020   PROT 6.8 11/06/2020   ALBUMIN 4.3 11/06/2020   CALCIUM 9.3 11/06/2020     ANIONGAP 9 09/21/2017   EGFR 93 11/06/2020   Lab Results  Component Value Date   CHOL 223 (H) 11/06/2020   Lab Results  Component Value Date   HDL 82 11/06/2020   Lab Results  Component Value Date   LDLCALC 129 (H) 11/06/2020   Lab Results  Component Value Date   TRIG 71 11/06/2020   Lab Results  Component Value Date   CHOLHDL 2.7 11/06/2020   No results found for: HGBA1C    Assessment & Plan:   Brandy Jefferson was seen today for obesity.  Diagnoses and all orders for this visit:  Morbid obesity ([place]) Continue diet. Increase physical activity. PDMP reviewed, no red flags. Refill of phentermine provided. Discussed saxenda today, she would like to think about this and discuss further at the next visit.  -     phentermine (ADIPEX-P) 37.5 MG tablet; Take 1 tablet (37.5 mg total) by mouth daily before breakfast.  Mild episode of recurrent major depressive disorder (HCC) Generalized anxiety disorder Reports feeling better with increased Celexa dosage despite higher screening score today. She is currently dealing with some family conflicts. She does not desire changes in medication today, will reassess at next visit.    The patient indicates understanding of these issues and agrees with the plan.  Follow-up: Return in about 4 weeks (around 03/28/2021) for weight .     Gwenlyn Perking, FNP

## 2021-03-02 ENCOUNTER — Other Ambulatory Visit: Payer: Self-pay | Admitting: Family Medicine

## 2021-03-02 DIAGNOSIS — F339 Major depressive disorder, recurrent, unspecified: Secondary | ICD-10-CM

## 2021-03-02 DIAGNOSIS — F411 Generalized anxiety disorder: Secondary | ICD-10-CM

## 2021-03-18 DIAGNOSIS — M722 Plantar fascial fibromatosis: Secondary | ICD-10-CM | POA: Diagnosis not present

## 2021-03-18 DIAGNOSIS — M79672 Pain in left foot: Secondary | ICD-10-CM | POA: Diagnosis not present

## 2021-03-31 ENCOUNTER — Encounter: Payer: Self-pay | Admitting: Family Medicine

## 2021-03-31 ENCOUNTER — Ambulatory Visit (INDEPENDENT_AMBULATORY_CARE_PROVIDER_SITE_OTHER): Payer: BC Managed Care – PPO | Admitting: Family Medicine

## 2021-03-31 ENCOUNTER — Other Ambulatory Visit: Payer: Self-pay

## 2021-03-31 DIAGNOSIS — J029 Acute pharyngitis, unspecified: Secondary | ICD-10-CM | POA: Diagnosis not present

## 2021-03-31 DIAGNOSIS — R059 Cough, unspecified: Secondary | ICD-10-CM

## 2021-03-31 LAB — CULTURE, GROUP A STREP

## 2021-03-31 LAB — RAPID STREP SCREEN (MED CTR MEBANE ONLY): Strep Gp A Ag, IA W/Reflex: NEGATIVE

## 2021-03-31 MED ORDER — PHENTERMINE HCL 37.5 MG PO TABS: 37.5000 mg | ORAL_TABLET | Freq: Every day | ORAL | 0 refills | Status: DC

## 2021-03-31 MED ORDER — BENZONATATE 100 MG PO CAPS: 100.0000 mg | ORAL_CAPSULE | Freq: Three times a day (TID) | ORAL | 0 refills | Status: DC | PRN

## 2021-03-31 NOTE — Patient Instructions (Signed)
Sore Throat ?A sore throat is pain, burning, irritation, or scratchiness in the throat. When you have a sore throat, you may feel pain or tenderness in your throat when you swallow or talk. ?Many things can cause a sore throat, including: ?An infection. ?Seasonal allergies. ?Dryness in the air. ?Irritants, such as smoke or pollution. ?Radiation treatment for cancer. ?Gastroesophageal reflux disease (GERD). ?A tumor. ?A sore throat is often the first sign of another sickness. It may happen with other symptoms, such as coughing, sneezing, fever, and swollen neck glands. Most sore throats go away without medical treatment. ?Follow these instructions at home: ?  ?Medicines ?Take over-the-counter and prescription medicines only as told by your health care provider. ?Children often get sore throats. Do not give your child aspirin because of the association with Reye's syndrome. ?Use throat sprays to soothe your throat as told by your health care provider. ?Managing pain ?To help with pain, try: ?Sipping warm liquids, such as broth, herbal tea, or warm water. ?Eating or drinking cold or frozen liquids, such as frozen ice pops. ?Gargling with a mixture of salt and water 3-4 times a day or as needed. To make salt water, completely dissolve ?-1 tsp (3-6 g) of salt in 1 cup (237 mL) of warm water. ?Sucking on hard candy or throat lozenges. ?Putting a cool-mist humidifier in your bedroom at night to moisten the air. ?Sitting in the bathroom with the door closed for 5-10 minutes while you run hot water in the shower. ?General instructions ?Do not use any products that contain nicotine or tobacco. These products include cigarettes, chewing tobacco, and vaping devices, such as e-cigarettes. If you need help quitting, ask your health care provider. ?Rest as needed. ?Drink enough fluid to keep your urine pale yellow. ?Wash your hands often with soap and water for at least 20 seconds. If soap and water are not available, use hand  sanitizer. ?Contact a health care provider if: ?You have a fever for more than 2-3 days. ?You have symptoms that last for more than 2-3 days. ?Your throat does not get better within 7 days. ?You have a fever and your symptoms suddenly get worse. ?Get help right away if: ?You have difficulty breathing. ?You cannot swallow fluids, soft foods, or your saliva. ?You have increased swelling in your throat or neck. ?You have persistent nausea and vomiting. ?These symptoms may represent a serious problem that is an emergency. Do not wait to see if the symptoms will go away. Get medical help right away. Call your local emergency services (911 in the [place] Do not drive yourself to the hospital. ?Summary ?A sore throat is pain, burning, irritation, or scratchiness in the throat. Many things can cause a sore throat. ?Take over-the-counter medicines only as told by your health care provider. ?Rest as needed. ?Drink enough fluid to keep your urine pale yellow. ?Contact a health care provider if your throat does not get better within 7 days. ?This information is not intended to replace advice given to you by your health care provider. Make sure you discuss any questions you have with your health care provider. ?Document Revised: 09/25/2020 Document Reviewed: 09/25/2020 ?Elsevier Patient Education ? 2022 Elsevier Inc. ? ?

## 2021-03-31 NOTE — Progress Notes (Signed)
Acute Office Visit  Subjective:    Patient ID: Brandy Jefferson, female    DOB: 02/19/1976, 45 y.o.   MRN: [id number]  Chief Complaint  Patient presents with   Obesity   Sore Throat    HPI Patient is in today for weight management. She is currently taking phentermine. She reports doing well without side effects. She has lost 1 lb since her last visit. She has been doing well with her diet. She has not been exercising but has been active with caring her her grandchildren.   Brandy Jefferson reports that her daughter had strep last week. Her grandson has also had a cough.She reports a sore throat that started today. She report cough and congestion that started 3 days ago. She denies fever. She has tried daytime and nighttime cold medication. She has also tried a cough syrup and elderberry without improvement. She denies nausea, vomiting, diarrhea, chest pain, shortness of breath, loss of taste or smell.    Past Medical History:  Diagnosis Date   Arteriovenous malformation of brain    Left temporal   Partial seizure ([place]) 12/07/2012   Plantar fasciitis    both feet   Seizures (HCC)    Partial seizures, right hemisensory    Past Surgical History:  Procedure Laterality Date   Arteriovenous malformation ablation  2004   Left temporal, intravascular procedure   MASS EXCISION N/A 09/29/2017   Procedure: EXCISION SEBACEOUS 2CM CYST ON BACK;  Surgeon: Virl Cagey, MD;  Location: [facility];  Service: General;  Laterality: N/A;    Family History  Problem Relation Age of Onset   Cancer Father     Social History   Socioeconomic History   Marital status: Single    Spouse name: Not on file   Number of children: 1   Years of education: 12   Highest education level: Not on file  Occupational History    Employer: [employer]  Tobacco Use   Smoking status: Never   Smokeless tobacco: Never  Vaping Use   Vaping Use: Never used  Substance and Sexual Activity   Alcohol use: No     Comment: Occasional   Drug use: No   Sexual activity: Not on file  Other Topics Concern   Not on file  Social History Narrative   Patient lives at home with daughter and her daughters dad.    Patient is not married.    Patient has 1 child.    Patient is currently working.    Patient drinks about 5 cups of caffeine daily   Patient is right handed.      Social Determinants of Health   Financial Resource Strain: Not on file  Food Insecurity: Not on file  Transportation Needs: Not on file  Physical Activity: Not on file  Stress: Not on file  Social Connections: Not on file  Intimate Partner Violence: Not on file    Outpatient Medications Prior to Visit  Medication Sig Dispense Refill   citalopram (CELEXA) 40 MG tablet Take 1 tablet (40 mg total) by mouth daily. 90 tablet 3   diclofenac (VOLTAREN) 75 MG EC tablet Take 75 mg by mouth twice daily  0   hydrochlorothiazide (HYDRODIURIL) 25 MG tablet Take 1 tablet (25 mg total) by mouth daily. 90 tablet 1   levETIRAcetam (KEPPRA) 500 MG tablet Take 1 tablet in the morning, take 2 at night 270 tablet 4   norgestimate-ethinyl estradiol (ORTHO-CYCLEN,SPRINTEC,PREVIFEM) 0.25-35 MG-MCG tablet Take 1 tablet by mouth  daily.     phentermine (ADIPEX-P) 37.5 MG tablet Take 1 tablet (37.5 mg total) by mouth daily before breakfast. 30 tablet 0   cyclobenzaprine (FLEXERIL) 10 MG tablet Take 1 tablet (10 mg total) by mouth 3 (three) times daily as needed for muscle spasms. (Patient not taking: Reported on 03/31/2021) 60 tablet 0   methylPREDNISolone (MEDROL DOSEPAK) 4 MG TBPK tablet Take by mouth as directed.     No facility-administered medications prior to visit.    No Known Allergies  Review of Systems As per HPI.     Objective:    Physical Exam Vitals and nursing note reviewed.  Constitutional:      General: She is not in acute distress.    Appearance: She is obese. She is not ill-appearing, toxic-appearing or diaphoretic.  HENT:      Right Ear: Tympanic membrane and ear canal normal. No drainage.     Left Ear: Tympanic membrane and ear canal normal. No drainage.     Nose: Congestion present.     Mouth/Throat:     Mouth: Mucous membranes are moist. No oral lesions.     Pharynx: Posterior oropharyngeal erythema present. No pharyngeal swelling, oropharyngeal exudate or uvula swelling.     Tonsils: No tonsillar exudate or tonsillar abscesses.  Eyes:     Conjunctiva/sclera: Conjunctivae normal.     Pupils: Pupils are equal, round, and reactive to light.  Cardiovascular:     Rate and Rhythm: Normal rate and regular rhythm.     Heart sounds: Normal heart sounds.  Pulmonary:     Effort: Pulmonary effort is normal. No respiratory distress.     Breath sounds: Normal breath sounds. No wheezing, rhonchi or rales.  Skin:    General: Skin is warm and dry.  Neurological:     General: No focal deficit present.     Mental Status: She is alert and oriented to person, place, and time.  Psychiatric:        Behavior: Behavior normal.    Temp 98.3 F (36.8 C) (Temporal)   Ht _0  (1.626 m)   Wt 250 lb 4 oz (113.5 kg)   BMI 42.96 kg/m  Wt Readings from Last 3 Encounters:  03/31/21 250 lb 4 oz (113.5 kg)  02/28/21 251 lb (113.9 kg)  01/31/21 253 lb (114.8 kg)    There are no preventive care reminders to display for this patient.  There are no preventive care reminders to display for this patient.   Lab Results  Component Value Date   TSH 2.470 11/06/2020   Lab Results  Component Value Date   WBC 8.3 11/06/2020   HGB 14.4 11/06/2020   HCT 43.5 11/06/2020   MCV 88 11/06/2020   PLT 427 11/06/2020   Lab Results  Component Value Date   NA 139 11/06/2020   K 4.6 11/06/2020   CO2 22 11/06/2020   GLUCOSE 79 11/06/2020   BUN 14 11/06/2020   CREATININE 0.80 11/06/2020   BILITOT 0.3 11/06/2020   ALKPHOS 63 11/06/2020   AST 18 11/06/2020   ALT 15 11/06/2020   PROT 6.8 11/06/2020   ALBUMIN 4.3 11/06/2020   CALCIUM  9.3 11/06/2020   ANIONGAP 9 09/21/2017   EGFR 93 11/06/2020   Lab Results  Component Value Date   CHOL 223 (H) 11/06/2020   Lab Results  Component Value Date   HDL 82 11/06/2020   Lab Results  Component Value Date   LDLCALC 129 (H) 11/06/2020   Lab  Results  Component Value Date   TRIG 71 11/06/2020   Lab Results  Component Value Date   CHOLHDL 2.7 11/06/2020   No results found for: HGBA1C     Assessment & Plan:   [place] was seen today for obesity and sore throat.  Diagnoses and all orders for this visit:  Morbid obesity ([place]) Discussed need to add exercise to assist with weight loss. Will try phentermine for 1 more month. Follow up in 8 weeks to discuss next steps.  -     phentermine (ADIPEX-P) 37.5 MG tablet; Take 1 tablet (37.5 mg total) by mouth daily before breakfast.  Sore throat Cough Negative rapid strep. Discussed viral etiology. Tessalon perles for cough. Mucinex for congestion. Covid test is pending, quarantine until test results. Discussed symptomatic care and return precautions.  -     Rapid Strep Screen ([facility] ONLY) -     benzonatate (TESSALON PERLES) 100 MG capsule; Take 1 capsule (100 mg total) by mouth 3 (three) times daily as needed for cough. -     Novel Coronavirus, NAA ([facility])  Return in about 8 weeks (around 05/26/2021) for weight.  The patient indicates understanding of these issues and agrees with the plan.  Gwenlyn Perking, FNP

## 2021-04-01 LAB — SARS-COV-2, NAA 2 DAY TAT

## 2021-04-01 LAB — NOVEL CORONAVIRUS, NAA: SARS-CoV-2, NAA: NOT DETECTED

## 2021-04-08 DIAGNOSIS — M79672 Pain in left foot: Secondary | ICD-10-CM | POA: Diagnosis not present

## 2021-04-08 DIAGNOSIS — M25572 Pain in left ankle and joints of left foot: Secondary | ICD-10-CM | POA: Diagnosis not present

## 2021-04-14 DIAGNOSIS — Z6841 Body Mass Index (BMI) 40.0 and over, adult: Secondary | ICD-10-CM | POA: Diagnosis not present

## 2021-04-14 DIAGNOSIS — J40 Bronchitis, not specified as acute or chronic: Secondary | ICD-10-CM | POA: Diagnosis not present

## 2021-04-14 DIAGNOSIS — R051 Acute cough: Secondary | ICD-10-CM | POA: Diagnosis not present

## 2021-05-09 ENCOUNTER — Other Ambulatory Visit: Payer: Self-pay | Admitting: Family Medicine

## 2021-05-09 DIAGNOSIS — R601 Generalized edema: Secondary | ICD-10-CM

## 2021-08-15 ENCOUNTER — Other Ambulatory Visit: Payer: Self-pay | Admitting: Family Medicine

## 2021-08-15 DIAGNOSIS — R601 Generalized edema: Secondary | ICD-10-CM

## 2021-09-15 DIAGNOSIS — Z6841 Body Mass Index (BMI) 40.0 and over, adult: Secondary | ICD-10-CM | POA: Diagnosis not present

## 2021-09-15 DIAGNOSIS — Z01419 Encounter for gynecological examination (general) (routine) without abnormal findings: Secondary | ICD-10-CM | POA: Diagnosis not present

## 2021-09-15 DIAGNOSIS — Z124 Encounter for screening for malignant neoplasm of cervix: Secondary | ICD-10-CM | POA: Diagnosis not present

## 2021-09-15 DIAGNOSIS — Z1151 Encounter for screening for human papillomavirus (HPV): Secondary | ICD-10-CM | POA: Diagnosis not present

## 2021-09-19 ENCOUNTER — Ambulatory Visit (INDEPENDENT_AMBULATORY_CARE_PROVIDER_SITE_OTHER): Payer: BC Managed Care – PPO | Admitting: Nurse Practitioner

## 2021-09-19 ENCOUNTER — Other Ambulatory Visit: Payer: Self-pay | Admitting: Family Medicine

## 2021-09-19 ENCOUNTER — Encounter: Payer: Self-pay | Admitting: Nurse Practitioner

## 2021-09-19 VITALS — BP 116/69 | HR 76 | Temp 98.7°F | Ht 64.0 in | Wt 268.0 lb

## 2021-09-19 DIAGNOSIS — M542 Cervicalgia: Secondary | ICD-10-CM

## 2021-09-19 DIAGNOSIS — R601 Generalized edema: Secondary | ICD-10-CM

## 2021-09-19 MED ORDER — METHYLPREDNISOLONE ACETATE 80 MG/ML IJ SUSP
80.0000 mg | Freq: Once | INTRAMUSCULAR | Status: AC
  Administered 2021-09-19: 80 mg via INTRAMUSCULAR

## 2021-09-19 MED ORDER — PREDNISONE 10 MG (21) PO TBPK: ORAL_TABLET | ORAL | 0 refills | Status: DC

## 2021-09-19 NOTE — Patient Instructions (Signed)
Hand Pain °Many things can cause hand pain. Some common causes are: °An injury. °Repeating the same movement with your hand over and over (overuse). °Osteoporosis. °Arthritis. °Lumps in the tendons or joints of the hand and wrist (ganglion cysts). °Nerve compression syndromes (carpal tunnel syndrome). °Inflammation of the tendons (tendinitis). °Infection. °Follow these instructions at home: °Pay attention to any changes in your symptoms. Take these actions to help with your discomfort: °Managing pain, stiffness, and swelling ° °Take over-the-counter and prescription medicines only as told by your health care provider. °Wear a hand splint or support as told by your health care provider. °If directed, put ice on the affected area: °Put ice in a plastic bag. °Place a towel between your skin and the bag. °Leave the ice on for 20 minutes, 2-3 times a day. °Activity °Take breaks from repetitive activity often. °Avoid activities that make your pain worse. °Minimize stress on your hands and wrists as much as possible. °Do stretches or exercises as told by your health care provider. °Do not do activities that make your pain worse. °Contact a health care provider if: °Your pain does not get better after a few days of self-care. °Your pain gets worse. °Your pain affects your ability to do your daily activities. °Get help right away if: °Your hand becomes warm, red, or swollen. °Your hand is numb or tingling. °Your hand is extremely swollen or deformed. °Your hand or fingers turn white or blue. °You cannot move your hand, wrist, or fingers. °Summary °Many things can cause hand pain. °Contact your health care provider if your pain does not get better after a few days of self care. °Minimize stress on your hands and wrists as much as possible. °Do not do activities that make your pain worse. °This information is not intended to replace advice given to you by your health care provider. Make sure you discuss any questions you have  with your health care provider. °Document Revised: 10/17/2020 Document Reviewed: 10/17/2020 °Elsevier Patient Education © 2022 [employer] Inc. ° °

## 2021-09-19 NOTE — Progress Notes (Signed)
? ?Acute Office Visit ? ?Subjective:  ? ? Patient ID: Brandy Jefferson, female    DOB: 02-Sep-1975, 46 y.o.   MRN: [id number] ? ?Chief Complaint  ?Patient presents with  ? Neck Pain  ?  Radiating down right arm  ? ? ?Neck Pain  ?This is a new problem. Episode onset: In the past 4 days. The problem occurs constantly. The problem has been unchanged. The pain is associated with lifting a heavy object. The quality of the pain is described as aching. The pain is at a severity of 5/10. The symptoms are aggravated by position. The pain is Same all the time. Associated symptoms include weakness. Pertinent negatives include no chest pain, fever, headaches, numbness, paresis, photophobia or tingling. She has tried NSAIDs for the symptoms. The treatment provided moderate relief.  ? ? ?Past Medical History:  ?Diagnosis Date  ? 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 Partial seizure ([place]) 12/07/2012  ? Plantar fasciitis   ? both feet  ? Seizures ([place])   ? Partial seizures, right hemisensory  ? ? ?Past Surgical History:  ?Procedure Laterality Date  ? Arteriovenous malformation ablation  2004  ? Left temporal, intravascular procedure  ? MASS EXCISION N/A 09/29/2017  ? Procedure: EXCISION SEBACEOUS 2CM CYST ON BACK;  Surgeon: Virl Cagey, MD;  Location: [facility];  Service: General;  Laterality: N/A;  ? ? ?Family History  ?Problem Relation Age of Onset  ? Cancer Father   ? ? ?Social History  ? ?Socioeconomic History  ? Marital status: Single  ?  Spouse name: Not on file  ? Number of children: 1  ? Years of education: 29  ? Highest education level: Not on file  ?Occupational History  ?  Employer: [employer]  ?Tobacco Use  ? Smoking status: Never  ? Smokeless tobacco: Never  ?Vaping Use  ? Vaping Use: Never used  ?Substance and Sexual Activity  ? Alcohol use: No  ?  Comment: Occasional  ? Drug use: No  ? Sexual activity: Not on file  ?Other Topics Concern  ? Not on file  ?Social History Narrative  ? Patient  lives at home with daughter and her daughters dad.   ? Patient is not married.   ? Patient has 1 child.   ? Patient is currently working.   ? Patient drinks about 5 cups of caffeine daily  ? Patient is right handed.  ?   ? ?Social Determinants of Health  ? ?Financial Resource Strain: Not on file  ?Food Insecurity: Not on file  ?Transportation Needs: Not on file  ?Physical Activity: Not on file  ?Stress: Not on file  ?Social Connections: Not on file  ?Intimate Partner Violence: Not on file  ? ? ?Outpatient Medications Prior to Visit  ?Medication Sig Dispense Refill  ? albuterol (VENTOLIN HFA) 108 (90 Base) MCG/ACT inhaler Inhale into the lungs.    ? citalopram (CELEXA) 40 MG tablet Take 1 tablet (40 mg total) by mouth daily. 90 tablet 3  ? cyclobenzaprine (FLEXERIL) 10 MG tablet Take 1 tablet (10 mg total) by mouth 3 (three) times daily as needed for muscle spasms. 60 tablet 0  ? diclofenac (VOLTAREN) 75 MG EC tablet Take 75 mg by mouth twice daily  0  ? fluconazole (DIFLUCAN) 150 MG tablet Take by mouth.    ? hydrochlorothiazide (HYDRODIURIL) 25 MG tablet Take 1 tablet (25 mg total) by mouth daily. (NEEDS TO BE SEEN BEFORE NEXT REFILL) 30  tablet 0  ? levETIRAcetam (KEPPRA) 500 MG tablet Take 1 tablet in the morning, take 2 at night 270 tablet 4  ? norgestimate-ethinyl estradiol (ORTHO-CYCLEN,SPRINTEC,PREVIFEM) 0.25-35 MG-MCG tablet Take 1 tablet by mouth daily.    ? nystatin-triamcinolone ointment (MYCOLOG) APPLY TO AFFECTED AREA TWICE A DAY    ? benzonatate (TESSALON PERLES) 100 MG capsule Take 1 capsule (100 mg total) by mouth 3 (three) times daily as needed for cough. 20 capsule 0  ? phentermine (ADIPEX-P) 37.5 MG tablet Take 1 tablet (37.5 mg total) by mouth daily before breakfast. 30 tablet 0  ? ?No facility-administered medications prior to visit.  ? ? ?No Known Allergies ? ?Review of Systems  ?Constitutional:  Negative for fever.  ?Eyes:  Negative for photophobia.  ?Cardiovascular:  Negative for chest pain.   ?Musculoskeletal:  Positive for neck pain.  ?Neurological:  Positive for weakness. Negative for tingling, numbness and headaches.  ?All other systems reviewed and are negative. ? ?   ?Objective:  ?  ?Physical Exam ?Vitals and nursing note reviewed.  ?Constitutional:   ?   Appearance: She is obese.  ?HENT:  ?   Right Ear: External ear normal.  ?   Left Ear: External ear normal.  ?   Mouth/Throat:  ?   Mouth: Mucous membranes are moist.  ?   Pharynx: Oropharynx is clear.  ?Eyes:  ?   Conjunctiva/sclera: Conjunctivae normal.  ?Cardiovascular:  ?   Pulses: Normal pulses.  ?   Heart sounds: Normal heart sounds.  ?Pulmonary:  ?   Effort: Pulmonary effort is normal.  ?   Breath sounds: Normal breath sounds.  ?Abdominal:  ?   General: Bowel sounds are normal.  ?Musculoskeletal:  ?   Right upper arm: Tenderness present. No swelling, edema or deformity.  ?Skin: ?   General: Skin is warm.  ?   Findings: No rash.  ?Neurological:  ?   Mental Status: She is alert and oriented to person, place, and time.  ? ? ?BP 116/69   Pulse 76   Temp 98.7 ?F (37.1 ?C)   Ht 5' 4" (1.626 m)   Wt 268 lb (121.6 kg)   LMP  (LMP Unknown)   SpO2 100%   BMI 46.00 kg/m?  ?Wt Readings from Last 3 Encounters:  ?09/19/21 268 lb (121.6 kg)  ?03/31/21 250 lb 4 oz (113.5 kg)  ?02/28/21 251 lb (113.9 kg)  ? ? ?There are no preventive care reminders to display for this patient. ? ?There are no preventive care reminders to display for this patient. ? ? ?Lab Results  ?Component Value Date  ? TSH 2.470 11/06/2020  ? ?Lab Results  ?Component Value Date  ? WBC 8.3 11/06/2020  ? HGB 14.4 11/06/2020  ? HCT 43.5 11/06/2020  ? MCV 88 11/06/2020  ? PLT 427 11/06/2020  ? ?Lab Results  ?Component Value Date  ? NA 139 11/06/2020  ? K 4.6 11/06/2020  ? CO2 22 11/06/2020  ? GLUCOSE 79 11/06/2020  ? BUN 14 11/06/2020  ? CREATININE 0.80 11/06/2020  ? BILITOT 0.3 11/06/2020  ? ALKPHOS 63 11/06/2020  ? AST 18 11/06/2020  ? ALT 15 11/06/2020  ? PROT 6.8 11/06/2020  ?  ALBUMIN 4.3 11/06/2020  ? CALCIUM 9.3 11/06/2020  ? ANIONGAP 9 09/21/2017  ? EGFR 93 11/06/2020  ? ?Lab Results  ?Component Value Date  ? CHOL 223 (H) 11/06/2020  ? ?Lab Results  ?Component Value Date  ? HDL 82 11/06/2020  ? ?Lab Results  ?  Component Value Date  ? LDLCALC 129 (H) 11/06/2020  ? ?Lab Results  ?Component Value Date  ? TRIG 71 11/06/2020  ? ?Lab Results  ?Component Value Date  ? CHOLHDL 2.7 11/06/2020  ? ?No results found for: HGBA1C ? ?   ?Assessment & Plan:  ?Right hand pain not well controlled in the past 4 days.  Encourage patient to rest arm, Flexeril 10 mg tablet by mouth as needed, Voltaren 75 mg tablet by mouth as needed.  Heating pad as needed, follow-up with unresolved symptoms.  Physical therapy, imaging and orthopedic referral in the future if symptoms are not well controlled.  Education provided to patient printed handouts given. ?Problem List Items Addressed This Visit   ?None ?Visit Diagnoses   ? ? Neck pain    -  Primary  ? Relevant Medications  ? methylPREDNISolone acetate (DEPO-MEDROL) injection 80 mg  ? predniSONE (STERAPRED UNI-PAK 21 TAB) 10 MG (21) TBPK tablet  ? ?  ? ? ? ?Meds ordered this encounter  ?Medications  ? methylPREDNISolone acetate (DEPO-MEDROL) injection 80 mg  ? predniSONE (STERAPRED UNI-PAK 21 TAB) 10 MG (21) TBPK tablet  ?  Sig: 6 tablets day 1, 5 tablet day 2, 4 tablet day 3, 3 tablet day 4, 2 tablet day 5, 1 tablet day 6  ?  Dispense:  1 each  ?  Refill:  0  ?  Order Specific Question:   Supervising Provider  ?  AnswerClaretta Fraise [[id number]]  ? ? ? ?Ivy Lynn, NP ? ?

## 2021-09-19 NOTE — Telephone Encounter (Signed)
[place] NTBS 30 days given 08/18/21 ?

## 2021-09-22 ENCOUNTER — Encounter: Payer: Self-pay | Admitting: Family Medicine

## 2021-09-22 NOTE — Telephone Encounter (Signed)
Called pt to schedule appt for med refills, N/A LMTCB ?Letter mailed ?

## 2021-09-29 DIAGNOSIS — Z1231 Encounter for screening mammogram for malignant neoplasm of breast: Secondary | ICD-10-CM | POA: Diagnosis not present

## 2021-10-30 ENCOUNTER — Ambulatory Visit (INDEPENDENT_AMBULATORY_CARE_PROVIDER_SITE_OTHER): Payer: BC Managed Care – PPO | Admitting: Family Medicine

## 2021-10-30 ENCOUNTER — Encounter: Payer: Self-pay | Admitting: Family Medicine

## 2021-10-30 ENCOUNTER — Ambulatory Visit: Payer: BC Managed Care – PPO | Admitting: Neurology

## 2021-10-30 DIAGNOSIS — R609 Edema, unspecified: Secondary | ICD-10-CM | POA: Diagnosis not present

## 2021-10-30 DIAGNOSIS — E782 Mixed hyperlipidemia: Secondary | ICD-10-CM | POA: Diagnosis not present

## 2021-10-30 DIAGNOSIS — F330 Major depressive disorder, recurrent, mild: Secondary | ICD-10-CM

## 2021-10-30 DIAGNOSIS — F411 Generalized anxiety disorder: Secondary | ICD-10-CM | POA: Diagnosis not present

## 2021-10-30 MED ORDER — HYDROCHLOROTHIAZIDE 25 MG PO TABS: 25.0000 mg | ORAL_TABLET | Freq: Every day | ORAL | 3 refills | Status: DC

## 2021-10-30 NOTE — Patient Instructions (Signed)
Here is a guide to help us find out which weight loss medications will be covered by your insurance plan.  Please check out this web site  [email or web address] and follow the 3 simple steps.   There is also a phone number you can call if you do not have access to the Internet. [phone] (Monday- Friday 8am-8pm)  [employer] provides coverage information for more than 80% of the inquiries submitted!!  

## 2021-10-30 NOTE — Progress Notes (Signed)
? ?Established Patient Office Visit ? ?Subjective   ?Patient ID: Brandy Jefferson, female    DOB: Aug 28, 1975  Age: 46 y.o. MRN: [id number] ? ?Chief Complaint  ?Patient presents with  ? Obesity  ? ? ?HPI ?Nyeemah would like to talk about weight loss medication. She reports maintaining a healthy diet with small portions but still gaining weight. She does not exercise due to time constraints but she is active throughout the day. She averages at least 11,000 steps a day. She wears a fitness watch. She has failed phentermine in the past x 2. ? ?She reports good and bad days with her anxiety and depression. She does not desire a change in this currently.  ? ?She needs a refill on HCTZ for edema. Denies chest pain, shortness of breath, orthopnea, dizziness, or palpitations.  ? ? ?  10/30/2021  ?  3:38 PM 09/19/2021  ?  3:52 PM 02/28/2021  ?  3:38 PM  ?Depression screen PHQ 2/9  ?Decreased Interest _0 ?Down, Depressed, Hopeless _1 ?PHQ - 2 Score _2 ?Altered sleeping _3 ?Tired, decreased energy _4 ?Change in appetite 2 2 0  ?Feeling bad or failure about yourself  1 2 0  ?Trouble concentrating 2 2 0  ?Moving slowly or fidgety/restless 0 0 0  ?Suicidal thoughts 0 1 0  ?PHQ-9 Score _5 ?Difficult doing work/chores Not difficult at all  Not difficult at all  ? ? ?  10/30/2021  ?  3:39 PM 09/19/2021  ?  3:54 PM 02/28/2021  ?  3:40 PM 01/31/2021  ?  3:40 PM  ?GAD 7 : Generalized Anxiety Score  ?Nervous, Anxious, on Edge 0 0 1 0  ?Control/stop worrying 0 0 1 0  ?Worry too much - different things _6 ?Trouble relaxing 0 1 1 0  ?Restless 0 0 1 0  ?Easily annoyed or irritable _7 ?Afraid - awful might happen 0 1 1 0  ?Total GAD 7 Score _8 ?Anxiety Difficulty Not difficult at all Not difficult at all Somewhat difficult Not difficult at all  ? ? ? ? ?ROS ?As per HPI.  ?  ?Objective:  ?  ? ?BP 102/61   Pulse 73   Temp 99.2 ?F (37.3 ?C) (Temporal)   Ht _9  (1.626 m)   Wt 269 lb (122 kg)   BMI  46.17 kg/m?  ?BP Readings from Last 3 Encounters:  ?10/30/21 102/61  ?09/19/21 116/69  ?03/31/21 90/62  ? ?  ? ?Physical Exam ?Vitals and nursing note reviewed.  ?Constitutional:   ?   General: She is not in acute distress. ?   Appearance: She is obese. She is not ill-appearing, toxic-appearing or diaphoretic.  ?HENT:  ?   Nose: Nose normal.  ?   Mouth/Throat:  ?   Mouth: Mucous membranes are moist.  ?   Pharynx: Oropharynx is clear.  ?Cardiovascular:  ?   Rate and Rhythm: Normal rate and regular rhythm.  ?   Heart sounds: Normal heart sounds. No murmur heard. ?Pulmonary:  ?   Effort: Pulmonary effort is normal. No respiratory distress.  ?   Breath sounds: Normal breath sounds.  ?Abdominal:  ?   General: Bowel sounds are normal. There is no distension.  ?   Palpations: Abdomen is soft.  ?   Tenderness: There is no abdominal tenderness. There  is no guarding or rebound.  ?Musculoskeletal:  ?   Right lower leg: No edema.  ?   Left lower leg: No edema.  ?Skin: ?   General: Skin is warm and dry.  ?Neurological:  ?   General: No focal deficit present.  ?   Mental Status: She is alert and oriented to person, place, and time.  ?Psychiatric:     ?   Mood and Affect: Mood normal.     ?   Behavior: Behavior normal.  ? ? ? ?No results found for any visits on 10/30/21. ? ?Last CBC ?Lab Results  ?Component Value Date  ? WBC 8.3 11/06/2020  ? HGB 14.4 11/06/2020  ? HCT 43.5 11/06/2020  ? MCV 88 11/06/2020  ? MCH 29.0 11/06/2020  ? RDW 12.2 11/06/2020  ? PLT 427 11/06/2020  ? ?Last metabolic panel ?Lab Results  ?Component Value Date  ? GLUCOSE 79 11/06/2020  ? NA 139 11/06/2020  ? K 4.6 11/06/2020  ? CL 101 11/06/2020  ? CO2 22 11/06/2020  ? BUN 14 11/06/2020  ? CREATININE 0.80 11/06/2020  ? EGFR 93 11/06/2020  ? CALCIUM 9.3 11/06/2020  ? PROT 6.8 11/06/2020  ? ALBUMIN 4.3 11/06/2020  ? LABGLOB 2.5 11/06/2020  ? AGRATIO 1.7 11/06/2020  ? BILITOT 0.3 11/06/2020  ? ALKPHOS 63 11/06/2020  ? AST 18 11/06/2020  ? ALT 15 11/06/2020  ?  ANIONGAP 9 09/21/2017  ? ?Last lipids ?Lab Results  ?Component Value Date  ? CHOL 223 (H) 11/06/2020  ? HDL 82 11/06/2020  ? LDLCALC 129 (H) 11/06/2020  ? TRIG 71 11/06/2020  ? CHOLHDL 2.7 11/06/2020  ? ?Last thyroid functions ?Lab Results  ?Component Value Date  ? TSH 2.470 11/06/2020  ? T4TOTAL 8.1 11/06/2020  ? ?  ? ?The 10-year ASCVD risk score (Arnett DK, et al., 2019) is: 0.4% ? ?  ?Assessment & Plan:  ? ?Nela was seen today for obesity. ? ?Diagnoses and all orders for this visit: ? ?Morbid obesity ([place]) ?Continue lifestyle modifications. Has failed phentermine x 2. Labs pending. Discussed saxenda and wegovy as options if covered. Patient will check with her insurance and notify office if this is covered.  ?-     CBC with Differential/Platelet ?-     CMP14+EGFR ?-     TSH ? ?Mixed hyperlipidemia ?Not fasting today, will check at another visit. Diet and exercise.  ? ?Mild episode of recurrent major depressive disorder ([place]) ?Generalized anxiety disorder ?Not well controlled. Denies SI. Continue celexa. Declined referral or changed in therapy today.  ? ?Peripheral edema ?Chronic problem. Labs pending. Continue HCTZ.  ?-     CBC with Differential/Platelet ?-     CMP14+EGFR ?-     hydrochlorothiazide (HYDRODIURIL) 25 MG tablet; Take 1 tablet (25 mg total) by mouth daily. ? ?Return in about 4 weeks (around 11/27/2021) for weight loss follow up. ? ?The patient indicates understanding of these issues and agrees with the plan. ? ?Gwenlyn Perking, FNP ? ?

## 2021-10-31 ENCOUNTER — Telehealth: Payer: Self-pay | Admitting: *Deleted

## 2021-10-31 ENCOUNTER — Telehealth: Payer: Self-pay | Admitting: Family Medicine

## 2021-10-31 LAB — CBC WITH DIFFERENTIAL/PLATELET
Basophils Absolute: 0.1 10*3/uL (ref 0.0–0.2)
Basos: 1 %
EOS (ABSOLUTE): 0.2 10*3/uL (ref 0.0–0.4)
Eos: 2 %
Hematocrit: 40.9 % (ref 34.0–46.6)
Hemoglobin: 13.5 g/dL (ref 11.1–15.9)
Immature Grans (Abs): 0 10*3/uL (ref 0.0–0.1)
Immature Granulocytes: 0 %
Lymphocytes Absolute: 2.9 10*3/uL (ref 0.7–3.1)
Lymphs: 29 %
MCH: 29 pg (ref 26.6–33.0)
MCHC: 33 g/dL (ref 31.5–35.7)
MCV: 88 fL (ref 79–97)
Monocytes Absolute: 0.7 10*3/uL (ref 0.1–0.9)
Monocytes: 7 %
Neutrophils Absolute: 6.3 10*3/uL (ref 1.4–7.0)
Neutrophils: 61 %
Platelets: 417 10*3/uL (ref 150–450)
RBC: 4.65 x10E6/uL (ref 3.77–5.28)
RDW: 12.4 % (ref 11.7–15.4)
WBC: 10.2 10*3/uL (ref 3.4–10.8)

## 2021-10-31 LAB — CMP14+EGFR
ALT: 17 IU/L (ref 0–32)
AST: 21 IU/L (ref 0–40)
Albumin/Globulin Ratio: 1.5 (ref 1.2–2.2)
Albumin: 3.8 g/dL (ref 3.8–4.8)
Alkaline Phosphatase: 58 IU/L (ref 44–121)
BUN/Creatinine Ratio: 23 (ref 9–23)
BUN: 16 mg/dL (ref 6–24)
Bilirubin Total: <0.2 mg/dL (ref 0.0–1.2)
CO2: 22 mmol/L (ref 20–29)
Calcium: 9.2 mg/dL (ref 8.7–10.2)
Chloride: 101 mmol/L (ref 96–106)
Creatinine, Ser: 0.71 mg/dL (ref 0.57–1.00)
Globulin, Total: 2.6 g/dL (ref 1.5–4.5)
Glucose: 78 mg/dL (ref 70–99)
Potassium: 4.3 mmol/L (ref 3.5–5.2)
Sodium: 137 mmol/L (ref 134–144)
Total Protein: 6.4 g/dL (ref 6.0–8.5)
eGFR: 106 mL/min/{1.73_m2} (ref >59)

## 2021-10-31 LAB — TSH: TSH: 2.04 u[IU]/mL (ref 0.450–4.500)

## 2021-10-31 MED ORDER — SEMAGLUTIDE-WEIGHT MANAGEMENT 2.4 MG/0.75ML ~~LOC~~ SOAJ: 2.4000 mg | SUBCUTANEOUS | 0 refills | Status: AC

## 2021-10-31 MED ORDER — SEMAGLUTIDE-WEIGHT MANAGEMENT 0.25 MG/0.5ML ~~LOC~~ SOAJ: 0.2500 mg | SUBCUTANEOUS | 0 refills | Status: AC

## 2021-10-31 MED ORDER — SEMAGLUTIDE-WEIGHT MANAGEMENT 1 MG/0.5ML ~~LOC~~ SOAJ: 1.0000 mg | SUBCUTANEOUS | 0 refills | Status: DC

## 2021-10-31 MED ORDER — SEMAGLUTIDE-WEIGHT MANAGEMENT 0.5 MG/0.5ML ~~LOC~~ SOAJ: 0.5000 mg | SUBCUTANEOUS | 0 refills | Status: AC

## 2021-10-31 MED ORDER — SEMAGLUTIDE-WEIGHT MANAGEMENT 1.7 MG/0.75ML ~~LOC~~ SOAJ: 1.7000 mg | SUBCUTANEOUS | 0 refills | Status: AC

## 2021-10-31 NOTE — Telephone Encounter (Signed)
PA for Wegovy-In Process ? ?KeyWarden Fillers) - [id number] ? ?Your information has been submitted to [facility]. [employer] [place] will review the request and notify you of the determination decision directly, typically within 72 hours of receiving all information. ? ?You will also receive your request decision electronically. To check for an update later, open this request again from your dashboard. ? ?If [employer] has not responded within the specified timeframe or if you have any questions about your PA submission, contact [employer] [place] directly at [phone]. ?

## 2021-10-31 NOTE — Telephone Encounter (Signed)
Orders

## 2021-10-31 NOTE — Telephone Encounter (Signed)
WEGOVY 0.25 MG/0.5ML SOAJ ? ?Pharmacy comment: Alternative Requested:NEEDS PA. ?

## 2021-10-31 NOTE — Telephone Encounter (Signed)
Pt aware meds were sent in and we will watch for a PA to kick back to [place]. ?10/31/21-jhb ?

## 2021-10-31 NOTE — Telephone Encounter (Signed)
See other telephone encounter, will close encounter. 

## 2021-10-31 NOTE — Telephone Encounter (Signed)
Patient wanted to let provider know that her insurance plan will cover the [facility].  It will need to have a prior authorization. ?

## 2021-11-03 NOTE — Telephone Encounter (Signed)
Message from plan: Effective from 10/31/2021 through 03/05/2022. Please note: This medication is to be titrated up in strength every 4 weeks as tolerated by the member. The quantity limit set of 4 pens per 180 days allows for this titration through all strengths using 4 pens of each strength every 28 days.  ?

## 2021-11-26 NOTE — Progress Notes (Signed)
PATIENT: Brandy Jefferson DOB: January 14, 1976  REASON FOR VISIT: follow up for seizures  HISTORY FROM: patient PRIMARY NEUROLOGIST: Dr.Willis/Dr. Teresa Coombs   HISTORY OF PRESENT ILLNESS: Today 11/27/21  Brandy Jefferson here today for follow-up.  Remains on Keppra 500/1000 mg daily.  No seizures, and around 6 years.  Her last seizure was triggered by missed dose of Keppra, brought on by bright lights.  Typical seizure event described as hands go numb, vision is distorted, drooling, stare off.  Her seizure history preceded ablation of left temporal AV malformation.  She works full-time in Producer, television/film/video.  Has started an injectable weight loss drug. Has a 75 year old grandson.  Update 10/30/2020 JK:Brandy Jefferson is a 46 year old female with history of AV malformation in the left temporal area that was ablated.  Has history of seizures, on Keppra 500/1000 mg.  No recurrent seizures in around 5 years.  Continues to do well.  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  She is getting back on track with her follow-up appointments.  Presents today for evaluation unaccompanied.  Update 02/02/2019 SS: Brandy Jefferson is a 46 year old female with history of been arteriovenous malformation in the left temporal area that was ablated.  She has history of seizures and has done well taking Keppra 500 in the morning, 1000 mg in the evening. She is tolerating the medication well. Her last seizure was 3-4 years ago. Her overall health as been good. Her arthritis is acting up, especially in her right shoulder. She works in Water engineer. She is driving a car without difficulty. She is on phentermine for weight loss.  She presents today for follow-up unaccompanied.  HISTORY 10/28/2017 Dr. Anne Hahn: Brandy Jefferson is a 46 year old right-handed white female with a history of an arteriovenous malformation in the left temporal area that was ablated, she has a history of seizures, she has done well on Keppra taking 500  mg in the morning and 1000 mg in the evening.  The patient tolerates the medication well without drowsiness or irritability.  She reports no seizures since last seen about 2 years ago.  The patient operates a motor vehicle without difficulty.  She recently had a cyst removed from her left shoulder but otherwise no new medical problems have been noted.  She returns to this office for an evaluation.   REVIEW OF SYSTEMS: Out of a complete 14 system review of symptoms, the patient complains only of the following symptoms, and all other reviewed systems are negative.  See HPI  ALLERGIES: No Known Allergies  HOME MEDICATIONS: Outpatient Medications Prior to Visit  Medication Sig Dispense Refill   albuterol (VENTOLIN HFA) 108 (90 Base) MCG/ACT inhaler Inhale into the lungs.     citalopram (CELEXA) 40 MG tablet Take 1 tablet (40 mg total) by mouth daily. 90 tablet 3   cyclobenzaprine (FLEXERIL) 10 MG tablet Take 1 tablet (10 mg total) by mouth 3 (three) times daily as needed for muscle spasms. 60 tablet 0   diclofenac (VOLTAREN) 75 MG EC tablet Take 75 mg by mouth twice daily  0   hydrochlorothiazide (HYDRODIURIL) 25 MG tablet Take 1 tablet (25 mg total) by mouth daily. 90 tablet 3   norgestimate-ethinyl estradiol (ORTHO-CYCLEN,SPRINTEC,PREVIFEM) 0.25-35 MG-MCG tablet Take 1 tablet by mouth daily.     nystatin-triamcinolone ointment (MYCOLOG) APPLY TO AFFECTED AREA TWICE A DAY     Semaglutide-Weight Management 0.25 MG/0.5ML SOAJ Inject 0.25 mg into the skin once a week for  28 days. 2 mL 0   [START ON 11/29/2021] Semaglutide-Weight Management 0.5 MG/0.5ML SOAJ Inject 0.5 mg into the skin once a week for 28 days. 2 mL 0   [START ON 12/28/2021] Semaglutide-Weight Management 1 MG/0.5ML SOAJ Inject 1 mg into the skin once a week for 28 days. 2 mL 0   [START ON 01/26/2022] Semaglutide-Weight Management 1.7 MG/0.75ML SOAJ Inject 1.7 mg into the skin once a week for 28 days. 3 mL 0   [START ON 02/24/2022]  Semaglutide-Weight Management 2.4 MG/0.75ML SOAJ Inject 2.4 mg into the skin once a week for 28 days. 3 mL 0   levETIRAcetam (KEPPRA) 500 MG tablet Take 1 tablet in the morning, take 2 at night 270 tablet 4   No facility-administered medications prior to visit.    PAST MEDICAL HISTORY: Past Medical History:  Diagnosis Date   Arteriovenous malformation of brain    Left temporal   Partial seizure ([facility]) 12/07/2012   Plantar fasciitis    both feet   Seizures ([facility])    Partial seizures, right hemisensory    PAST SURGICAL HISTORY: Past Surgical History:  Procedure Laterality Date   Arteriovenous malformation ablation  2004   Left temporal, intravascular procedure   MASS EXCISION N/A 09/29/2017   Procedure: EXCISION SEBACEOUS 2CM CYST ON BACK;  Surgeon: Lucretia RoersBridges, Lindsay C, MD;  Location: [facility];  Service: General;  Laterality: N/A;    FAMILY HISTORY: Family History  Problem Relation Age of Onset   Cancer Father     SOCIAL HISTORY: Social History   Socioeconomic History   Marital status: Single    Spouse name: Not on file   Number of children: 1   Years of education: 12   Highest education level: Not on file  Occupational History    Employer: [employer]  Tobacco Use   Smoking status: Never   Smokeless tobacco: Never  Vaping Use   Vaping Use: Never used  Substance and Sexual Activity   Alcohol use: No    Comment: Occasional   Drug use: No   Sexual activity: Not on file  Other Topics Concern   Not on file  Social History Narrative   Patient lives at home with daughter and her daughters dad.    Patient is not married.    Patient has 1 child.    Patient is currently working.    Patient drinks about 5 cups of caffeine daily   Patient is right handed.      Social Determinants of Health   Financial Resource Strain: Not on file  Food Insecurity: Not on file  Transportation Needs: Not on file  Physical Activity: Not on file  Stress: Not on file  Social  Connections: Not on file  Intimate Partner Violence: Not on file   PHYSICAL EXAM  Vitals:   11/27/21 1104  BP: 116/81  Pulse: 77  Weight: 261 lb (118.4 kg)  Height: 5\' 5"  (1.651 m)    Body mass index is 43.43 kg/m.  Generalized: Well developed, in no acute distress  Neurological examination  Mentation: Alert oriented to time, place, history taking. Follows all commands speech and language fluent Cranial nerve II-XII: Pupils were equal round reactive to light. Extraocular movements were full, visual field were full on confrontational test. Facial sensation and strength were normal.  Head turning and shoulder shrug  were normal and symmetric. Motor: The motor testing reveals 5 over 5 strength of all 4 extremities.  Good symmetric motor tone is noted throughout.  Sensory: Sensory testing is intact to soft touch on all 4 extremities. No evidence of extinction is noted.  Coordination: Cerebellar testing reveals good finger-nose-finger and heel-to-shin bilaterally.  Gait and station: Gait is normal.  Reflexes: Deep tendon reflexes are symmetric and normal bilaterally.   DIAGNOSTIC DATA (LABS, IMAGING, TESTING) - I reviewed patient records, labs, notes, testing and imaging myself where available.  Lab Results  Component Value Date   WBC 10.2 10/30/2021   HGB 13.5 10/30/2021   HCT 40.9 10/30/2021   MCV 88 10/30/2021   PLT 417 10/30/2021      Component Value Date/Time   NA 137 10/30/2021 1626   K 4.3 10/30/2021 1626   CL 101 10/30/2021 1626   CO2 22 10/30/2021 1626   GLUCOSE 78 10/30/2021 1626   GLUCOSE 96 09/21/2017 1504   BUN 16 10/30/2021 1626   CREATININE 0.71 10/30/2021 1626   CALCIUM 9.2 10/30/2021 1626   PROT 6.4 10/30/2021 1626   ALBUMIN 3.8 10/30/2021 1626   AST 21 10/30/2021 1626   ALT 17 10/30/2021 1626   ALKPHOS 58 10/30/2021 1626   BILITOT <0.2 10/30/2021 1626   GFRNONAA >60 09/21/2017 1504   GFRAA >60 09/21/2017 1504   Lab Results  Component Value Date    CHOL 223 (H) 11/06/2020   HDL 82 11/06/2020   LDLCALC 129 (H) 11/06/2020   TRIG 71 11/06/2020   CHOLHDL 2.7 11/06/2020   No results found for: HGBA1C No results found for: VITAMINB12 Lab Results  Component Value Date   TSH 2.040 10/30/2021    ASSESSMENT AND PLAN 46 y.o. year old female   1.  History of seizures, well controlled  -Brandy Jefferson is doing well, excited about using new weight loss injectable drug -Continue Keppra at current dosing, refill sent in -Follow-up in 1 year 15 min VV, call for seizures  Meds ordered this encounter  Medications   levETIRAcetam (KEPPRA) 500 MG tablet    Sig: Take 1 tablet in the morning, take 2 at night    Dispense:  270 tablet    Refill:  4   Margie Ege, [place], DNP 11/27/2021, 11:34 AM [facility] [address], [address] [place], [place] [address] [phone]

## 2021-11-27 ENCOUNTER — Encounter: Payer: Self-pay | Admitting: Neurology

## 2021-11-27 ENCOUNTER — Ambulatory Visit: Payer: BC Managed Care – PPO | Admitting: Neurology

## 2021-11-27 VITALS — BP 116/81 | HR 77 | Ht 65.0 in | Wt 261.0 lb

## 2021-11-27 DIAGNOSIS — R569 Unspecified convulsions: Secondary | ICD-10-CM

## 2021-11-27 MED ORDER — LEVETIRACETAM 500 MG PO TABS
ORAL_TABLET | ORAL | 4 refills | Status: DC
Start: 2021-11-27 — End: 2022-12-01

## 2021-12-03 ENCOUNTER — Encounter: Payer: Self-pay | Admitting: Family Medicine

## 2021-12-03 ENCOUNTER — Ambulatory Visit: Payer: BC Managed Care – PPO | Admitting: Family Medicine

## 2021-12-03 DIAGNOSIS — F330 Major depressive disorder, recurrent, mild: Secondary | ICD-10-CM | POA: Diagnosis not present

## 2021-12-03 DIAGNOSIS — F411 Generalized anxiety disorder: Secondary | ICD-10-CM

## 2021-12-03 MED ORDER — SEMAGLUTIDE-WEIGHT MANAGEMENT 1 MG/0.5ML ~~LOC~~ SOAJ
1.0000 mg | SUBCUTANEOUS | 0 refills | Status: AC
Start: 2021-12-26 — End: 2022-01-23

## 2021-12-03 MED ORDER — SEMAGLUTIDE-WEIGHT MANAGEMENT 1 MG/0.5ML ~~LOC~~ SOAJ: 1.0000 mg | SUBCUTANEOUS | 0 refills | Status: DC

## 2021-12-03 NOTE — Progress Notes (Signed)
Established Patient Office Visit  Subjective   Patient ID: Brandy Jefferson, female    DOB: Jan 26, 1976  Age: 46 y.o. MRN: [id number]  Chief Complaint  Patient presents with   Obesity    HPI  Brandy Jefferson presents for weight management. She started on wegovy a little over 4 weeks ago. She has started on the 0.5 mg and reports doing well so far. She reports some upset stomach for the first day after the injection and then this resolved. She has noticed a decrease in appeite. She has been eating smaller portions, particularly during the day. She does report that she struggles with overeating in the evenings.   She reports that anxiety and depression symptoms are stable and she does not currently desire treatment for this.     12/03/2021    8:40 AM 10/30/2021    3:38 PM 09/19/2021    3:52 PM  Depression screen PHQ 2/9  Decreased Interest 1 1 3   Down, Depressed, Hopeless 1 1 2   PHQ - 2 Score 2 2 5   Altered sleeping 2 2 2   Tired, decreased energy 1 1 2   Change in appetite 0 2 2  Feeling bad or failure about yourself  0 1 2  Trouble concentrating 1 2 2   Moving slowly or fidgety/restless 0 0 0  Suicidal thoughts 0 0 1  PHQ-9 Score 6 10 16   Difficult doing work/chores Not difficult at all Not difficult at all       12/03/2021    8:41 AM 10/30/2021    3:39 PM 09/19/2021    3:54 PM 02/28/2021    3:40 PM  GAD 7 : Generalized Anxiety Score  Nervous, Anxious, on Edge 0 0 0 1  Control/stop worrying 1 0 0 1  Worry too much - different things 1 1 1 2   Trouble relaxing 1 0 1 1  Restless 0 0 0 1  Easily annoyed or irritable 1 1 1 1   Afraid - awful might happen 0 0 1 1  Total GAD 7 Score 4 2 4 8   Anxiety Difficulty Not difficult at all Not difficult at all Not difficult at all Somewhat difficult     Patient Active Problem List   Diagnosis Date Noted   Mixed hyperlipidemia 10/30/2021   Mild episode of recurrent major depressive disorder ([place]) 02/28/2021   Generalized anxiety disorder  02/28/2021   Sebaceous cyst    Morbid obesity ([place]) 07/28/2017   Partial seizure ([place]) 12/07/2012   History of arteriovenous malformation ([facility]) 12/07/2012      ROS As per HPI.    Objective:     BP 98/61   Pulse 69   Temp 98.3 F (36.8 C) (Temporal)   Ht 5\' 5"  (1.651 m)   Wt 258 lb 8 oz (117.3 kg)   SpO2 98%   BMI 43.02 kg/m  BP Readings from Last 3 Encounters:  12/03/21 98/61  11/27/21 116/81  10/30/21 102/61      Physical Exam Vitals and nursing note reviewed.  Constitutional:      General: She is not in acute distress.    Appearance: She is not ill-appearing, toxic-appearing or diaphoretic.  Cardiovascular:     Rate and Rhythm: Normal rate and regular rhythm.     Heart sounds: Normal heart sounds. No murmur heard. Pulmonary:     Effort: Pulmonary effort is normal. No respiratory distress.     Breath sounds: Normal breath sounds.  Abdominal:     General: Bowel sounds are  normal. There is no distension.     Palpations: Abdomen is soft.     Tenderness: There is no abdominal tenderness. There is no guarding or rebound.  Musculoskeletal:     Right lower leg: No edema.     Left lower leg: No edema.  Skin:    General: Skin is warm and dry.  Neurological:     General: No focal deficit present.     Mental Status: She is alert and oriented to person, place, and time.  Psychiatric:        Mood and Affect: Mood normal.     No results found for any visits on 12/03/21.    The 10-year ASCVD risk score (Arnett DK, et al., 2019) is: 0.3%    Assessment & Plan:   Brandy Jefferson was seen today for obesity.  Diagnoses and all orders for this visit:  Morbid obesity ([place]) Doing well on wegocy. Continue 0.5 mg x 4 weeks, then increase to 1 mg. Discussed diet and exercise.  -     Semaglutide-Weight Management 1 MG/0.5ML SOAJ; Inject 1 mg into the skin once a week for 28 days.  Generalized anxiety disorder Mild episode of recurrent major depressive disorder  (HCC) Stable, denies SI. Does not currently desire treatment.    Return in about 4 weeks (around 12/31/2021) for weight management.  The patient indicates understanding of these issues and agrees with the plan.    Gwenlyn Perking, FNP

## 2021-12-03 NOTE — Patient Instructions (Signed)

## 2021-12-06 ENCOUNTER — Other Ambulatory Visit: Payer: Self-pay | Admitting: Family Medicine

## 2021-12-19 ENCOUNTER — Telehealth: Payer: Self-pay | Admitting: Family Medicine

## 2021-12-19 NOTE — Telephone Encounter (Signed)
Semaglutide-Weight Management 0.5 MG/0.5ML SOAJ is out of stock for a while. Pt tolerated the 0.5. pt wants to know what Brandy Jefferson thinks what about going off the wegovy and trying another medication due to wegovy being out of stock. OR pt is ok going up on wegovy dosage per Tiffany's advice. Pt would skip the 1MG  and go to 1.7MG  because the 1.7 MG is in stock. Use [facility].  Please call back

## 2021-12-22 MED ORDER — SAXENDA 18 MG/3ML ~~LOC~~ SOPN: 1.2000 mg | PEN_INJECTOR | Freq: Every day | SUBCUTANEOUS | 0 refills | Status: AC

## 2021-12-22 MED ORDER — NOVOFINE PEN NEEDLE 32G X 6 MM MISC: 1.0000 | Freq: Every day | 0 refills | Status: DC

## 2021-12-22 NOTE — Telephone Encounter (Signed)
That is too much of an increase at one time to go from 0.5 mg of Wegovy to 1.7. I have sent in Saxenda 1.2 mg to use in place of Wegovy 1 mg. Bernie Covey is a daily injection. Use this for the next 4 weeks, then we can plan to switch to [facility] 1.7.

## 2021-12-22 NOTE — Telephone Encounter (Signed)
I spoke to the pt and she says she has only been on the 0.25mg  dose of Wegovy due to the 0.5mg  being out of stock and [facility] different doses as being ready the 0.5 and 1mg  so she is going to call to find out which dose is ready and if it is the 0.5mg  dose she is going to pick that one up and start her weekly injections but if it is the 1 mg she will call me back to check with Tiffany first.

## 2022-01-05 NOTE — Telephone Encounter (Signed)
I spoke to the pt today and the 0.5mg  is still on backorder and it has been a month at this point. Her insurance won't cover the saxenda with the [facility] still processing. Should she go back on the 0.25mg  Wegovy at this point?

## 2022-01-07 NOTE — Telephone Encounter (Signed)
LM to RC  

## 2022-01-07 NOTE — Telephone Encounter (Signed)
She can try calling different pharmacies to see if any are in stock for the 0.5 mg because this is more effective. If no one has it is stock, she can continue the 0.25 mg and try to extend it out to every 10 days instead of 7.

## 2022-01-29 NOTE — Telephone Encounter (Signed)
NA °No call back  °This encounter will be closed  °

## 2022-01-30 ENCOUNTER — Ambulatory Visit: Payer: BC Managed Care – PPO | Admitting: Family Medicine

## 2022-01-30 ENCOUNTER — Encounter: Payer: Self-pay | Admitting: Family Medicine

## 2022-01-30 ENCOUNTER — Other Ambulatory Visit (HOSPITAL_BASED_OUTPATIENT_CLINIC_OR_DEPARTMENT_OTHER): Payer: Self-pay

## 2022-01-30 VITALS — BP 105/72 | HR 70 | Temp 98.3°F | Ht 65.0 in | Wt 266.1 lb

## 2022-01-30 DIAGNOSIS — I809 Phlebitis and thrombophlebitis of unspecified site: Secondary | ICD-10-CM

## 2022-01-30 MED ORDER — SEMAGLUTIDE-WEIGHT MANAGEMENT 0.5 MG/0.5ML ~~LOC~~ SOAJ
0.5000 mL | SUBCUTANEOUS | 0 refills | Status: DC
  Filled 2022-01-30: qty 2, 28d supply, fill #0

## 2022-01-30 MED ORDER — NAPROXEN 500 MG PO TABS: 500.0000 mg | ORAL_TABLET | Freq: Two times a day (BID) | ORAL | 0 refills | Status: DC

## 2022-01-30 NOTE — Patient Instructions (Signed)
[facility]  I will send in naproxen for 2 weeks. Follow up in 2 weeks to recheck. Warm compresses TID

## 2022-01-30 NOTE — Progress Notes (Signed)
   Acute Office Visit  Subjective:     Patient ID: Brandy Jefferson, female    DOB: 1975-08-14, 46 y.o.   MRN: [id number]  Chief Complaint  Patient presents with   Leg Pain    Leg Pain  The incident occurred more than 1 week ago. There was no injury mechanism. The pain is present in the right leg (right lower leg, below knee). Quality: tenderness. The pain is mild. The pain has been Constant since onset. Pertinent negatives include no inability to bear weight, loss of motion, loss of sensation, muscle weakness, numbness or tingling. She reports no foreign bodies present. The symptoms are aggravated by palpation. She has tried ice, elevation, acetaminophen and rest for the symptoms. The treatment provided no relief.    Review of Systems  Constitutional:  Negative for chills and fever.  Respiratory:  Negative for shortness of breath and wheezing.   Cardiovascular:  Negative for chest pain and orthopnea.  Musculoskeletal:  Negative for falls.  Neurological:  Negative for tingling and numbness.        Objective:    BP 105/72   Pulse 70   Temp 98.3 F (36.8 C) (Temporal)   Ht 5\' 5"  (1.651 m)   Wt 266 lb 2 oz (120.7 kg)   SpO2 97%   BMI 44.29 kg/m    Physical Exam Vitals and nursing note reviewed.  Constitutional:      General: She is not in acute distress.    Appearance: She is not ill-appearing, toxic-appearing or diaphoretic.  Pulmonary:     Effort: Pulmonary effort is normal. No respiratory distress.  Musculoskeletal:     Right lower leg: No swelling, deformity or bony tenderness. No edema.     Left lower leg: No deformity or bony tenderness. No edema.     Comments: Tenderness, mild swelling, with bruising to proximal anterior right lower leg. Nodules palpated under skin in this area. No warmth or erythema. Varicose veins present.   Skin:    General: Skin is warm and dry.  Neurological:     General: No focal deficit present.     Mental Status: She is alert and  oriented to person, place, and time.  Psychiatric:        Mood and Affect: Mood normal.        Behavior: Behavior normal.     No results found for any visits on 01/30/22.      Assessment & Plan:   Adelita was seen today for leg pain.  Diagnoses and all orders for this visit:  Phlebitis Naproxen as below. Do not take other NSAIDs with this. Follow up in 2 weeks, sooner for new or worsening symptoms.  -     naproxen (NAPROSYN) 500 MG tablet; Take 1 tablet (500 mg total) by mouth 2 (two) times daily with a meal.   Return in about 2 weeks (around 02/13/2022) for leg follow up.  The patient indicates understanding of these issues and agrees with the plan.   04/15/2022, FNP

## 2022-02-02 ENCOUNTER — Other Ambulatory Visit (HOSPITAL_BASED_OUTPATIENT_CLINIC_OR_DEPARTMENT_OTHER): Payer: Self-pay

## 2022-02-04 ENCOUNTER — Other Ambulatory Visit (HOSPITAL_BASED_OUTPATIENT_CLINIC_OR_DEPARTMENT_OTHER): Payer: Self-pay

## 2022-02-11 ENCOUNTER — Other Ambulatory Visit (HOSPITAL_BASED_OUTPATIENT_CLINIC_OR_DEPARTMENT_OTHER): Payer: Self-pay

## 2022-02-13 ENCOUNTER — Ambulatory Visit: Payer: BC Managed Care – PPO | Admitting: Family Medicine

## 2022-02-13 ENCOUNTER — Encounter: Payer: Self-pay | Admitting: Family Medicine

## 2022-02-13 VITALS — BP 92/66 | HR 70 | Temp 98.2°F | Ht 65.0 in | Wt 259.2 lb

## 2022-02-13 DIAGNOSIS — I809 Phlebitis and thrombophlebitis of unspecified site: Secondary | ICD-10-CM | POA: Diagnosis not present

## 2022-02-13 MED ORDER — DICLOFENAC SODIUM 75 MG PO TBEC: DELAYED_RELEASE_TABLET | ORAL | 1 refills | Status: DC

## 2022-02-13 NOTE — Patient Instructions (Signed)
Semaglutide Injection (Weight Management) What is this medication? SEMAGLUTIDE (SEM a GLOO tide) promotes weight loss. It may also be used to maintain weight loss. It works by decreasing appetite. Changes to diet and exercise are often combined with this medication. This medicine may be used for other purposes; ask your health care provider or pharmacist if you have questions. COMMON BRAND NAME(S): Wegovy What should I tell my care team before I take this medication? They need to know if you have any of these conditions: Endocrine tumors (MEN 2) or if someone in your family had these tumors Eye disease, vision problems Gallbladder disease History of depression or mental health disease History of pancreatitis Kidney disease Stomach or intestine problems Suicidal thoughts, plans, or attempt; a previous suicide attempt by you or a family member Thyroid cancer or if someone in your family had thyroid cancer An unusual or allergic reaction to semaglutide, other medications, foods, dyes, or preservatives Pregnant or trying to get pregnant Breast-feeding How should I use this medication? This medication is injected under the skin. You will be taught how to prepare and give it. Take it as directed on the prescription label. It is given once every week (every 7 days). Keep taking it unless your care team tells you to stop. It is important that you put your used needles and pens in a special sharps container. Do not put them in a trash can. If you do not have a sharps container, call your pharmacist or care team to get one. A special MedGuide will be given to you by the pharmacist with each prescription and refill. Be sure to read this information carefully each time. This medication comes with INSTRUCTIONS FOR USE. Ask your pharmacist for directions on how to use this medication. Read the information carefully. Talk to your pharmacist or care team if you have questions. Talk to your care team about  the use of this medication in children. While it may be prescribed for children as young as 12 years for selected conditions, precautions do apply. Overdosage: If you think you have taken too much of this medicine contact a poison control center or emergency room at once. NOTE: This medicine is only for you. Do not share this medicine with others. What if I miss a dose? If you miss a dose and the next scheduled dose is more than 2 days away, take the missed dose as soon as possible. If you miss a dose and the next scheduled dose is less than 2 days away, do not take the missed dose. Take the next dose at your regular time. Do not take double or extra doses. If you miss your dose for 2 weeks or more, take the next dose at your regular time or call your care team to talk about how to restart this medication. What may interact with this medication? Insulin and other medications for diabetes This list may not describe all possible interactions. Give your health care provider a list of all the medicines, herbs, non-prescription drugs, or dietary supplements you use. Also tell them if you smoke, drink alcohol, or use illegal drugs. Some items may interact with your medicine. What should I watch for while using this medication? Visit your care team for regular checks on your progress. It may be some time before you see the benefit from this medication. Drink plenty of fluids while taking this medication. Check with your care team if you have severe diarrhea, nausea, and vomiting, or if you sweat a   lot. The loss of too much body fluid may make it dangerous for you to take this medication. This medication may affect blood sugar levels. Ask your care team if changes in diet or medications are needed if you have diabetes. If you or your family notice any changes in your behavior, such as new or worsening depression, thoughts of harming yourself, anxiety, other unusual or disturbing thoughts, or memory loss, call  your care team right away. Women should inform their care team if they wish to become pregnant or think they might be pregnant. Losing weight while pregnant is not advised and may cause harm to the unborn child. Talk to your care team for more information. What side effects may I notice from receiving this medication? Side effects that you should report to your care team as soon as possible: Allergic reactions--skin rash, itching, hives, swelling of the face, lips, tongue, or throat Change in vision Dehydration--increased thirst, dry mouth, feeling faint or lightheaded, headache, dark yellow or brown urine Gallbladder problems--severe stomach pain, nausea, vomiting, fever Heart palpitations--rapid, pounding, or irregular heartbeat Kidney injury--decrease in the amount of urine, swelling of the ankles, hands, or feet Pancreatitis--severe stomach pain that spreads to your back or gets worse after eating or when touched, fever, nausea, vomiting Thoughts of suicide or self-harm, worsening mood, feelings of depression Thyroid cancer--new mass or lump in the neck, pain or trouble swallowing, trouble breathing, hoarseness Side effects that usually do not require medical attention (report to your care team if they continue or are bothersome): Diarrhea Loss of appetite Nausea Stomach pain Vomiting This list may not describe all possible side effects. Call your doctor for medical advice about side effects. You may report side effects to FDA at 1-800-FDA-1088. Where should I keep my medication? Keep out of the reach of children and pets. Refrigeration (preferred): Store in the refrigerator. Do not freeze. Keep this medication in the original container until you are ready to take it. Get rid of any unused medication after the expiration date. Room temperature: If needed, prior to cap removal, the pen can be stored at room temperature for up to 28 days. Protect from light. If it is stored at room  temperature, get rid of any unused medication after 28 days or after it expires, whichever is first. It is important to get rid of the medication as soon as you no longer need it or it is expired. You can do this in two ways: Take the medication to a medication take-back program. Check with your pharmacy or law enforcement to find a location. If you cannot return the medication, follow the directions in the MedGuide. NOTE: This sheet is a summary. It may not cover all possible information. If you have questions about this medicine, talk to your doctor, pharmacist, or health care provider.  2023 Elsevier/Gold Standard (2020-09-12 00:00:00)  

## 2022-02-13 NOTE — Progress Notes (Signed)
   Established Patient Office Visit  Subjective   Patient ID: Brandy Jefferson, female    DOB: 1976/02/29  Age: 46 y.o. MRN: [id number]  Chief Complaint  Patient presents with   phlebitis    HPI Brandy Jefferson is here for follow up of phlebitis in her right lower leg. She has been taking naproxen BID for 2 weeks now. She reports that tenderness has resolved. She did have some erythema that extended up her posterior thigh last week but this has now resolved. Denies shortness of breath, chest pain, numbness, tingling, or edema. Deneis fever.   She has started on Wegovy again. She denies any side effects. She has lost 7 lbs in 2 weeks. She has been maintaining a healthy diet and exercising.    Past Medical History:  Diagnosis Date   Arteriovenous malformation of brain    Left temporal   Partial seizure (HCC) 12/07/2012   Plantar fasciitis    both feet   Seizures (HCC)    Partial seizures, right hemisensory   ROS As per HPI.   Objective:     BP 92/66   Pulse 70   Temp 98.2 F (36.8 C) (Temporal)   Ht 5\' 5"  (1.651 m)   Wt 259 lb 4 oz (117.6 kg)   SpO2 97%   BMI 43.14 kg/m  BP Readings from Last 3 Encounters:  02/13/22 92/66  01/30/22 105/72  12/03/21 98/61      Physical Exam Vitals and nursing note reviewed.  Constitutional:      General: She is not in acute distress.    Appearance: She is not ill-appearing, toxic-appearing or diaphoretic.  Cardiovascular:     Rate and Rhythm: Normal rate and regular rhythm.     Heart sounds: Normal heart sounds. No murmur heard. Pulmonary:     Effort: Pulmonary effort is normal.     Breath sounds: Normal breath sounds.  Musculoskeletal:     Right lower leg: No edema.     Left lower leg: No edema.     Comments: Right lower leg: no tenderness, erythema, exudate, induration, or fluctuance. Varicose veins present.  Right upper leg: no tenderness, erythema, exudate, induration, or fluctuance.   Skin:    General: Skin is warm and dry.   Neurological:     General: No focal deficit present.     Mental Status: She is alert and oriented to person, place, and time.  Psychiatric:        Mood and Affect: Mood normal.        Behavior: Behavior normal.    No results found for any visits on 02/13/22.    The 10-year ASCVD risk score (Arnett DK, et al., 2019) is: 0.3%    Assessment & Plan:   Brandy Jefferson was seen today for phlebitis.  Diagnoses and all orders for this visit:  Phlebitis Significantly improved. Will do voltaren BID x 14 days. Strict return precautions.  -     diclofenac (VOLTAREN) 75 MG EC tablet; Take 75 mg by mouth twice daily  Morbid obesity (HCC) Down 7 lbs with wegovy. Continue with diet and exercise.   Return in about 6 weeks (around 03/27/2022) for weight management.  The patient indicates understanding of these issues and agrees with the plan.    03/29/2022, FNP

## 2022-02-24 ENCOUNTER — Other Ambulatory Visit (HOSPITAL_BASED_OUTPATIENT_CLINIC_OR_DEPARTMENT_OTHER): Payer: Self-pay

## 2022-02-24 ENCOUNTER — Encounter (HOSPITAL_BASED_OUTPATIENT_CLINIC_OR_DEPARTMENT_OTHER): Payer: Self-pay

## 2022-02-25 ENCOUNTER — Other Ambulatory Visit (HOSPITAL_BASED_OUTPATIENT_CLINIC_OR_DEPARTMENT_OTHER): Payer: Self-pay

## 2022-04-06 ENCOUNTER — Encounter: Payer: Self-pay | Admitting: Nurse Practitioner

## 2022-04-06 ENCOUNTER — Ambulatory Visit (INDEPENDENT_AMBULATORY_CARE_PROVIDER_SITE_OTHER): Payer: BC Managed Care – PPO

## 2022-04-06 ENCOUNTER — Ambulatory Visit: Payer: BC Managed Care – PPO | Admitting: Nurse Practitioner

## 2022-04-06 VITALS — BP 102/62 | HR 78 | Temp 98.4°F | Ht 65.0 in | Wt 255.2 lb

## 2022-04-06 DIAGNOSIS — I809 Phlebitis and thrombophlebitis of unspecified site: Secondary | ICD-10-CM

## 2022-04-06 DIAGNOSIS — M25511 Pain in right shoulder: Secondary | ICD-10-CM

## 2022-04-06 DIAGNOSIS — G8929 Other chronic pain: Secondary | ICD-10-CM | POA: Diagnosis not present

## 2022-04-06 MED ORDER — METHYLPREDNISOLONE ACETATE 80 MG/ML IJ SUSP
80.0000 mg | Freq: Once | INTRAMUSCULAR | Status: AC
  Administered 2022-04-06: 80 mg via INTRAMUSCULAR

## 2022-04-06 MED ORDER — CYCLOBENZAPRINE HCL 10 MG PO TABS: 10.0000 mg | ORAL_TABLET | Freq: Three times a day (TID) | ORAL | 0 refills | Status: AC | PRN

## 2022-04-06 MED ORDER — DICLOFENAC SODIUM 75 MG PO TBEC: DELAYED_RELEASE_TABLET | ORAL | 1 refills | Status: DC

## 2022-04-06 NOTE — Progress Notes (Signed)
Acute Office Visit  Subjective:     Patient ID: Brandy Jefferson, female    DOB: 07-07-1976, 46 y.o.   MRN: [id number]  Chief Complaint  Patient presents with   Shoulder Pain    Shoulder Pain  The pain is present in the right shoulder. This is a recurrent problem. The current episode started 1 to 4 weeks ago. The problem occurs constantly. The problem has been gradually worsening. The quality of the pain is described as aching. The pain is at a severity of 7/10. The pain is severe. Pertinent negatives include no itching. The symptoms are aggravated by activity.    Review of Systems  Constitutional: Negative.   HENT: Negative.    Eyes: Negative.   Respiratory: Negative.    Cardiovascular: Negative.   Gastrointestinal: Negative.   Genitourinary: Negative.   Musculoskeletal:  Positive for joint pain.  Skin: Negative.  Negative for itching and rash.  Neurological: Negative.         Objective:    BP 102/62   Pulse 78   Temp 98.4 F (36.9 C) (Temporal)   Ht 5\' 5"  (1.651 m)   Wt 255 lb 4 oz (115.8 kg)   SpO2 100%   BMI 42.48 kg/m  BP Readings from Last 3 Encounters:  04/06/22 102/62  02/13/22 92/66  01/30/22 105/72      Physical Exam Vitals and nursing note reviewed.  Constitutional:      Appearance: Normal appearance.  HENT:     Head: Normocephalic.     Right Ear: External ear normal.     Left Ear: External ear normal.     Nose: Nose normal.  Cardiovascular:     Rate and Rhythm: Normal rate and regular rhythm.     Pulses: Normal pulses.  Pulmonary:     Effort: Pulmonary effort is normal.     Breath sounds: Normal breath sounds.  Abdominal:     General: Bowel sounds are normal.  Musculoskeletal:     Right shoulder: Tenderness present. No swelling or deformity. Decreased range of motion.       Arms:     Comments: Right shoulder pain  Skin:    General: Skin is warm.  Neurological:     General: No focal deficit present.     Mental Status: She is  alert and oriented to person, place, and time.     No results found for any visits on 04/06/22.      Assessment & Plan:  Recurrent right upper shoulder pain symptoms not well resolved.  Completed right shoulder x-ray results pending.  Robaxin for musculoskeletal pain, continue Voltaren 75 mg tablet by mouth twice daily.  Continue Flexeril 10 mg tablet by mouth daily. Follow-up worsening unresolved symptoms.  Problem List Items Addressed This Visit   None Visit Diagnoses     Chronic right shoulder pain    -  Primary   Relevant Medications   diclofenac (VOLTAREN) 75 MG EC tablet   cyclobenzaprine (FLEXERIL) 10 MG tablet   methylPREDNISolone acetate (DEPO-MEDROL) injection 80 mg (Completed)   Other Relevant Orders   DG Shoulder Right   Phlebitis       Relevant Medications   diclofenac (VOLTAREN) 75 MG EC tablet       Meds ordered this encounter  Medications   diclofenac (VOLTAREN) 75 MG EC tablet    Sig: Take 75 mg by mouth twice daily    Dispense:  60 tablet    Refill:  1  Order Specific Question:   Supervising Provider    Answer:   Mechele Claude [phone]   cyclobenzaprine (FLEXERIL) 10 MG tablet    Sig: Take 1 tablet (10 mg total) by mouth 3 (three) times daily as needed for muscle spasms.    Dispense:  60 tablet    Refill:  0    Order Specific Question:   Supervising Provider    Answer:   Mechele Claude [[id number]]   methylPREDNISolone acetate (DEPO-MEDROL) injection 80 mg    No follow-ups on file.  Daryll Drown, NP

## 2022-04-07 ENCOUNTER — Other Ambulatory Visit: Payer: Self-pay | Admitting: Family Medicine

## 2022-04-07 DIAGNOSIS — F339 Major depressive disorder, recurrent, unspecified: Secondary | ICD-10-CM

## 2022-04-07 DIAGNOSIS — F411 Generalized anxiety disorder: Secondary | ICD-10-CM

## 2022-04-08 ENCOUNTER — Ambulatory Visit: Payer: BC Managed Care – PPO | Admitting: Family Medicine

## 2022-04-08 ENCOUNTER — Encounter: Payer: Self-pay | Admitting: Family Medicine

## 2022-04-08 DIAGNOSIS — R110 Nausea: Secondary | ICD-10-CM | POA: Diagnosis not present

## 2022-04-08 MED ORDER — WEGOVY 1.7 MG/0.75ML ~~LOC~~ SOAJ: 1.7000 mg | SUBCUTANEOUS | 0 refills | Status: DC

## 2022-04-08 MED ORDER — WEGOVY 2.4 MG/0.75ML ~~LOC~~ SOAJ: 2.4000 mg | SUBCUTANEOUS | 6 refills | Status: DC

## 2022-04-08 MED ORDER — ONDANSETRON HCL 4 MG PO TABS: 4.0000 mg | ORAL_TABLET | Freq: Three times a day (TID) | ORAL | 0 refills | Status: DC | PRN

## 2022-04-08 NOTE — Progress Notes (Signed)
Established Patient Office Visit  Subjective   Patient ID: Brandy Jefferson, female    DOB: 05/21/1976  Age: 46 y.o. MRN: [id number]  Chief Complaint  Patient presents with   Obesity    HPI Brandy Jefferson is here for follow up of obesity. She has started on Wegovy. She is now on the 1 mg dosage. She will take the 2nd 1 mg injection tomorrow. She has lost 4 lbs since starting. She reports a decreased in appetite and cravings. She has decreased portion sizes. She has trying to focus on lean meats, low fat dairy, and protein. She reports some intermittent mild nausea, belching, and constipation as side effects. These have been tolerable. Denies abdominal pain, vomiting, or heartburn. She stays active throughout the day.     ROS As per HPI.    Objective:     BP 95/65   Pulse 74   Temp 98.8 F (37.1 C) (Temporal)   Ht 5\' 5"  (1.651 m)   Wt 255 lb 2 oz (115.7 kg)   SpO2 100%   BMI 42.46 kg/m  Wt Readings from Last 3 Encounters:  04/08/22 255 lb 2 oz (115.7 kg)  04/06/22 255 lb 4 oz (115.8 kg)  02/13/22 259 lb 4 oz (117.6 kg)      Physical Exam Vitals and nursing note reviewed.  Constitutional:      General: She is not in acute distress.    Appearance: She is obese. She is not ill-appearing, toxic-appearing or diaphoretic.  HENT:     Head: Normocephalic and atraumatic.  Eyes:     Pupils: Pupils are equal, round, and reactive to light.  Cardiovascular:     Rate and Rhythm: Normal rate and regular rhythm.     Heart sounds: Normal heart sounds. No murmur heard. Pulmonary:     Effort: Pulmonary effort is normal. No respiratory distress.     Breath sounds: Normal breath sounds.  Abdominal:     General: Bowel sounds are normal. There is no distension.     Palpations: Abdomen is soft.     Tenderness: There is no abdominal tenderness. There is no guarding or rebound.  Musculoskeletal:     Right lower leg: No edema.     Left lower leg: No edema.  Skin:    General: Skin is warm  and dry.  Neurological:     General: No focal deficit present.     Mental Status: She is alert and oriented to person, place, and time.  Psychiatric:        Mood and Affect: Mood normal.      No results found for any visits on 04/08/22.    The 10-year ASCVD risk score (Arnett DK, et al., 2019) is: 0.3%    Assessment & Plan:   [name] was seen today for obesity.  Diagnoses and all orders for this visit:  Morbid obesity [facility]) Doing well with wegovy so far. Will send in higher doses for when patient is ready. Discussed titration. Discussed diet and side effect management. Will send in zofran to have on hand for nausea. Will check BMP at next visit.  -     Semaglutide-Weight Management (WEGOVY) 1.7 MG/0.75ML SOAJ; Inject 1.7 mg into the skin once a week. -     Semaglutide-Weight Management (WEGOVY) 2.4 MG/0.75ML SOAJ; Inject 2.4 mg into the skin once a week.  Nausea -     ondansetron (ZOFRAN) 4 MG tablet; Take 1 tablet (4 mg total) by mouth every 8 (eight)  hours as needed for nausea or vomiting.  Return in about 6 weeks (around 05/20/2022) for weight follow up.   The patient indicates understanding of these issues and agrees with the plan.  Gwenlyn Perking, FNP

## 2022-04-08 NOTE — Patient Instructions (Signed)
Protein Content in Foods Protein is a necessary nutrient in any diet. It helps build and repair muscles, bones, and skin. Depending on your overall health, you may need more or less protein in your diet. You are encouraged to eat a variety of protein foods to ensure that you get all the essential nutrients that are found in different protein foods. Talk with your health care provider or dietitian about how much protein you need each day and which sources of protein are best for you. Protein is especially important for: Repairing and making cells and tissues. Fighting infection. Providing energy. Growth and development. See the following list for the protein content of some common foods. What are tips for getting more protein in your diet? Try to replace processed carbohydrates with high-quality protein. Snack on nuts and seeds instead of chips. Replace baked desserts with Greek yogurt. Eat protein foods from both plant and animal sources. Replace red meat with seafood. Add beans and peas to salads, soups, and side dishes. Include a protein food with each meal and snack. Reading food labels You can find the amount of protein in a food item by looking at the nutrition facts label. Use the total grams listed to help you reach your daily goal. What foods are high in protein?  High-protein foods contain 4 grams (g) or more of protein per serving. They include: Grains Quinoa (cooked) -- 1 cup (185 g) has 8 g of protein. Whole wheat pasta (cooked) -- 1 cup (140 g) has 6 g of protein. Meat Beef, ground sirloin (cooked) -- 3 oz (85 g) has 24 g of protein. Chicken breast, boneless and skinless (cooked) -- 3 oz (85 g) has 25 g of protein. Egg -- 1 egg has 6 g of protein. Fish, filet (cooked) -- 1 oz (28 g) has 6-7 g of protein. Lamb (cooked) -- 3 oz (85 g) has 24 g of protein. Pork tenderloin (cooked) -- 3 oz (85 g) has 23 g of protein. Tuna (canned in water) -- 3 oz (85 g) has 20 g of  protein. Dairy Cottage cheese --  cup (114 g) has 13.4 g of protein. Milk -- 1 cup (237 mL) has 8 g of protein. Cheese (hard) -- 1 oz (28 g) has 7 g of protein. Yogurt, regular -- 6 oz (170 g) has 8 g of protein. Greek yogurt -- 6 oz (200 g) has 18 g protein. Plant protein Garbanzo beans (canned or cooked) --  cup (130 g) has 6-7 g of protein. Kidney beans (canned or cooked) --  cup (130 g) has 6-7 g of protein. Nuts (peanuts, pistachios, almonds) -- 1 oz (28 g) has 6 g of protein. Peanut butter -- 1 oz (32 g) has 7-8 g of protein. Pumpkin seeds -- 1 oz (28 g) has 8.5 g of protein. Soybeans (roasted) -- 1 oz (28 g) has 8 g of protein. Soybeans (cooked) --  cup (90 g) has 11 g of protein. Soy milk -- 1 cup (250 mL) has 5-10 g of protein. Soy or vegetable patty -- 1 patty has 11 g of protein. Sunflower seeds -- 1 oz (28 g) has 5.5 g of protein. Buckwheat -- 1 oz (33 g) has 4.3 g of protein. Tofu (firm) --  cup (124 g) has 20 g of protein. Tempeh --  cup (83 g) has 16 g of protein. The items listed above may not be a complete list of foods high in protein. Actual amounts of protein may differ   depending on processing. Contact a dietitian for more information. What foods are low in protein?  Low-protein foods contain 3 grams (g) or less of protein per serving. They include: Fruits Fruit or vegetable juice --  cup (125 mL) has 1 g of protein. Vegetables Beets (raw or cooked) --  cup (68 g) has 1.5 g of protein. Broccoli (raw or cooked) --  cup (44 g) has 2 g of protein. Collard greens (raw or cooked) --  cup (42 g) has 2 g of protein. Green beans (raw or cooked) --  cup (83 g) has 1 g of protein. Green peas (canned) --  cup (80 g) has 3.5 g of protein. Potato (baked with skin) -- 1 medium potato (173 g) has 3 g of protein. Spinach (cooked) --  cup (90 g) has 3 g of protein. Squash (cooked) --  cup (90 g) has 1.5 g of protein. Avocado -- 1 cup (146 g) has 2.7 g of  protein. Grains Bran cereal --  cup (30 g) has 2-3 g of protein. Bread -- 1 slice has 2.5 g of protein. Corn (fresh or cooked) --  cup (77 g) has 2 g of protein. Flour tortilla -- One 6-inch (15 cm) tortilla has 2.5 g of protein. Muffins -- 1 small muffin (2 oz or 57 g) has 3 g of protein. Oatmeal (cooked) --  cup (40 g) has 3 g of protein. Rice (cooked) --  cup (79 g) has 2.5-3.5 g of protein. Dairy Cream cheese -- 1 oz (29 g) has 2 g of protein. Creamer (half-and-half) -- 1 oz (29 mL) has 1 g of protein. Frozen yogurt --  cup (72 g) has 3 g of protein. Sour cream --  cup (75 g) has 2.5 g of protein. The items listed above may not be a complete list of foods low in protein. Actual amounts of protein may differ depending on processing. Contact a dietitian for more information. Summary Protein is a nutrient that your body needs for growth and development, repairing and making cells and tissues, fighting infection, and providing energy. Protein is in both plant and animal foods. Some of these foods have more protein than others. Depending on your overall health, you may need more or less protein in your diet. Talk to your health care provider about how much protein you need. This information is not intended to replace advice given to you by your health care provider. Make sure you discuss any questions you have with your health care provider. Document Revised: 06/03/2020 Document Reviewed: 06/03/2020 Elsevier Patient Education  2023 Elsevier Inc.  

## 2022-04-13 ENCOUNTER — Telehealth: Payer: Self-pay

## 2022-04-13 NOTE — Telephone Encounter (Signed)
Brandy Jefferson (Key: [id number]) Rx #: [id number] KMMNOT 1.7MG /0.75ML auto-injectors   Form [employer] [place] [employer] Electronic Request Form (CB) Created 5 days ago Sent to Plan 6 minutes ago Plan Response 6 minutes ago Submit Clinical Questions less than a minute ago Determination Wait for Determination Please wait for [employer] to return a determination.

## 2022-04-14 NOTE — Telephone Encounter (Signed)
Your prior authorization for Mancel Parsons has been approved! MORE INFO Personalized support and financial assistance may be available through the [employer] program. For more information, and to see program requirements, click on the More Info button to the right.  Message from plan: Effective from 04/13/2022 through 08/16/2022.   Pharmacy notified

## 2022-05-21 ENCOUNTER — Encounter: Payer: Self-pay | Admitting: Family Medicine

## 2022-05-21 ENCOUNTER — Ambulatory Visit: Payer: BC Managed Care – PPO | Admitting: Family Medicine

## 2022-05-21 DIAGNOSIS — H6691 Otitis media, unspecified, right ear: Secondary | ICD-10-CM | POA: Diagnosis not present

## 2022-05-21 DIAGNOSIS — R110 Nausea: Secondary | ICD-10-CM | POA: Diagnosis not present

## 2022-05-21 DIAGNOSIS — M25511 Pain in right shoulder: Secondary | ICD-10-CM | POA: Diagnosis not present

## 2022-05-21 DIAGNOSIS — R609 Edema, unspecified: Secondary | ICD-10-CM | POA: Diagnosis not present

## 2022-05-21 LAB — BMP8+EGFR
BUN/Creatinine Ratio: 14 (ref 9–23)
BUN: 10 mg/dL (ref 6–24)
CO2: 22 mmol/L (ref 20–29)
Calcium: 9.5 mg/dL (ref 8.7–10.2)
Chloride: 99 mmol/L (ref 96–106)
Creatinine, Ser: 0.74 mg/dL (ref 0.57–1.00)
Glucose: 83 mg/dL (ref 70–99)
Potassium: 3.9 mmol/L (ref 3.5–5.2)
Sodium: 137 mmol/L (ref 134–144)
eGFR: 101 mL/min/{1.73_m2} (ref >59)

## 2022-05-21 MED ORDER — AMOXICILLIN 875 MG PO TABS: 875.0000 mg | ORAL_TABLET | Freq: Two times a day (BID) | ORAL | 0 refills | Status: AC

## 2022-05-21 MED ORDER — ONDANSETRON HCL 4 MG PO TABS: 4.0000 mg | ORAL_TABLET | Freq: Three times a day (TID) | ORAL | 0 refills | Status: DC | PRN

## 2022-05-21 MED ORDER — HYDROCHLOROTHIAZIDE 12.5 MG PO TABS: 12.5000 mg | ORAL_TABLET | Freq: Every day | ORAL | 0 refills | Status: DC

## 2022-05-21 NOTE — Progress Notes (Signed)
Established Patient Office Visit  Subjective   Patient ID: Brandy Jefferson, female    DOB: [birth year]  Age: 46 y.o. MRN: [id number]  Chief Complaint  Patient presents with   Weight Check    Wegovy  Bilateral ear check    HPI Brandy Jefferson is here for a weight loss follow up. She is currently on 1.7 mg of Wegovy. She has been on this dosage for 2 weeks now. She sometimes has some nausea, usually for the first few days after her injection. She also has increased belching. Otherwise she hasn't noticed any side effects. She is drinking protein shakes and eating a healthy diet overall. She has noticed a decreased in appetite and smaller portions. She remains active but has not been able to participates in regular exercise due to time constraints.   She reports right ear pressure for about 1 weeks. She denies drainage or fever. She has some chronic intermittent rhinitis symptoms. She wears ear plugs at work. Her hearing has been muffled.     ROS Negative unless specially indicated above in HPI.   Objective:     BP 90/65   Pulse 74   Temp 98.5 F (36.9 C)   Ht _0  (1.651 m)   Wt 242 lb (109.8 kg)   LMP  (LMP Unknown)   SpO2 99%   BMI 40.27 kg/m  Wt Readings from Last 3 Encounters:  05/21/22 242 lb (109.8 kg)  04/08/22 255 lb 2 oz (115.7 kg)  04/06/22 255 lb 4 oz (115.8 kg)   BP Readings from Last 3 Encounters:  05/21/22 90/65  04/08/22 95/65  04/06/22 102/62     Physical Exam Vitals and nursing note reviewed.  Constitutional:      General: She is not in acute distress.    Appearance: She is not ill-appearing, toxic-appearing or diaphoretic.  HENT:     Right Ear: Ear canal and external ear normal. A middle ear effusion is present. Tympanic membrane is not scarred, perforated, erythematous or bulging.     Left Ear: Ear canal and external ear normal. A middle ear effusion is present. Tympanic membrane is bulging. Tympanic membrane is not scarred, perforated or  erythematous.  Cardiovascular:     Rate and Rhythm: Normal rate and regular rhythm.     Heart sounds: Normal heart sounds. No murmur heard. Pulmonary:     Effort: Pulmonary effort is normal. No respiratory distress.     Breath sounds: Normal breath sounds.  Abdominal:     General: Bowel sounds are normal. There is no distension.     Palpations: Abdomen is soft.     Tenderness: There is no abdominal tenderness. There is no guarding or rebound.  Musculoskeletal:     Right lower leg: No edema.     Left lower leg: No edema.  Skin:    General: Skin is warm and dry.  Neurological:     General: No focal deficit present.     Mental Status: She is alert and oriented to person, place, and time.  Psychiatric:        Mood and Affect: Mood normal.        Behavior: Behavior normal.      No results found for any visits on 05/21/22.    The 10-year ASCVD risk score (Arnett DK, et al., 2019) is: 0.3%    Assessment & Plan:   Brandy Jefferson was seen today for weight check.  Diagnoses and all orders for this visit:  Morbid obesity (  [place]) Down 13 pounds since last visit. Continue wegovy, diet, and physical activity. Will recheck kidney function today.  -     BMP8+EGFR  Nausea Discussed prevention of nausea while on wegovy. Zofran prn.  -     ondansetron (ZOFRAN) 4 MG tablet; Take 1 tablet (4 mg total) by mouth every 8 (eight) hours as needed for nausea or vomiting.  Acute right otitis media Amoxicillin as below. TM opaque and bulging.  -     amoxicillin (AMOXIL) 875 MG tablet; Take 1 tablet (875 mg total) by mouth 2 (two) times daily for 7 days.  Peripheral edema No edema on exam today. Soft BP x 2 in office. Decrease HCTZ to 12.5. Monitor BP at home and notify for persistently high or low readings.  -     hydrochlorothiazide (HYDRODIURIL) 12.5 MG tablet; Take 1 tablet (12.5 mg total) by mouth daily.   Return in about 6 weeks (around 07/02/2022) for chronic follow up.   The patient  indicates understanding of these issues and agrees with the plan.   Gwenlyn Perking, FNP

## 2022-06-18 DIAGNOSIS — M7541 Impingement syndrome of right shoulder: Secondary | ICD-10-CM | POA: Diagnosis not present

## 2022-06-29 ENCOUNTER — Encounter: Payer: Self-pay | Admitting: Family Medicine

## 2022-06-29 ENCOUNTER — Ambulatory Visit: Payer: BC Managed Care – PPO | Admitting: Family Medicine

## 2022-06-29 VITALS — BP 102/71 | HR 73 | Temp 98.6°F | Ht 65.0 in | Wt 236.5 lb

## 2022-06-29 DIAGNOSIS — M7541 Impingement syndrome of right shoulder: Secondary | ICD-10-CM

## 2022-06-29 DIAGNOSIS — R569 Unspecified convulsions: Secondary | ICD-10-CM

## 2022-06-29 DIAGNOSIS — R110 Nausea: Secondary | ICD-10-CM

## 2022-06-29 DIAGNOSIS — R609 Edema, unspecified: Secondary | ICD-10-CM

## 2022-06-29 DIAGNOSIS — Z6839 Body mass index (BMI) 39.0-39.9, adult: Secondary | ICD-10-CM

## 2022-06-29 DIAGNOSIS — F320 Major depressive disorder, single episode, mild: Secondary | ICD-10-CM | POA: Diagnosis not present

## 2022-06-29 DIAGNOSIS — R600 Localized edema: Secondary | ICD-10-CM

## 2022-06-29 MED ORDER — ONDANSETRON HCL 4 MG PO TABS: 4.0000 mg | ORAL_TABLET | Freq: Three times a day (TID) | ORAL | 0 refills | Status: DC | PRN

## 2022-06-29 NOTE — Progress Notes (Signed)
Established Patient Office Visit  Subjective   Patient ID: Brandy Jefferson, female    DOB: July 16, 1975  Age: 46 y.o. MRN: [id number]  Chief Complaint  Patient presents with   Obesity    HPI Elanna is here for weight management follow up. She continues on Wegovy 2.4 mg weekly. She reports occasional nausea that is relieved with zofran. She denies other side effects. She has not been able to exercise lately, but stays active throughout the day. She has been maintaining a healthy diet. She is down another 6 lbs in the last 6 weeks or so.   She saw neurology earlier this year. Currenlty following up yearly. She is on Keppra and has not had any breakthrough seizures.   She saw ortho recently for her right shoulder pain. She had an injection in her shoulder and plans to start PT. She is taking Voltaren as well.   She continues to take HCTZ daily for peripheral edema with good relief.       05/21/2022    9:44 AM 04/06/2022    3:44 PM 02/13/2022    8:43 AM  Depression screen PHQ 2/9  Decreased Interest 1 1 1   Down, Depressed, Hopeless 0 0 1  PHQ - 2 Score 1 1 2   Altered sleeping 1 1 2   Tired, decreased energy 1 1 2   Change in appetite 0 1 0  Feeling bad or failure about yourself  0 0 0  Trouble concentrating 0 1 1  Moving slowly or fidgety/restless 0 0 0  Suicidal thoughts 0 0 0  PHQ-9 Score 3 5 7   Difficult doing work/chores Not difficult at all Not difficult at all Not difficult at all      05/21/2022    9:45 AM 04/06/2022    3:45 PM 02/13/2022    8:44 AM 01/30/2022    2:43 PM  GAD 7 : Generalized Anxiety Score  Nervous, Anxious, on Edge 0 0 0 0  Control/stop worrying 0 0 0 1  Worry too much - different things 0 1 1 2   Trouble relaxing 1 1 0 2  Restless 1 0 0 1  Easily annoyed or irritable 1 1 1 2   Afraid - awful might happen 0 0 0 1  Total GAD 7 Score 3 3 2 9   Anxiety Difficulty Not difficult at all Not difficult at all Not difficult at all Not difficult at all        ROS As per HPI.    Objective:     BP 102/71   Pulse 73   Temp 98.6 F (37 C) (Temporal)   Ht 5\' 5"  (1.651 m)   Wt 236 lb 8 oz (107.3 kg)   SpO2 100%   BMI 39.36 kg/m  Wt Readings from Last 3 Encounters:  06/29/22 236 lb 8 oz (107.3 kg)  05/21/22 242 lb (109.8 kg)  04/08/22 255 lb 2 oz (115.7 kg)      Physical Exam Vitals and nursing note reviewed.  Constitutional:      General: She is not in acute distress.    Appearance: She is not ill-appearing, toxic-appearing or diaphoretic.  Neck:     Thyroid: No thyroid mass, thyromegaly or thyroid tenderness.  Cardiovascular:     Rate and Rhythm: Normal rate and regular rhythm.     Heart sounds: Normal heart sounds. No murmur heard. Pulmonary:     Effort: Pulmonary effort is normal. No respiratory distress.     Breath sounds: Normal breath  sounds.  Abdominal:     General: Bowel sounds are normal. There is no distension.     Palpations: Abdomen is soft.     Tenderness: There is no abdominal tenderness. There is no guarding or rebound.  Musculoskeletal:     Right lower leg: No edema.     Left lower leg: No edema.  Skin:    General: Skin is warm and dry.  Neurological:     General: No focal deficit present.     Mental Status: She is alert and oriented to person, place, and time.  Psychiatric:        Mood and Affect: Mood normal.        Behavior: Behavior normal.        Thought Content: Thought content normal.      No results found for any visits on 06/29/22.    The 10-year ASCVD risk score (Arnett DK, et al., 2019) is: 0.4%    Assessment & Plan:   Manaal was seen today for obesity.  Diagnoses and all orders for this visit:  Class 2 severe obesity due to excess calories with serious comorbidity and body mass index (BMI) of 39.0 to 39.9 in adult [facility]) Doing well with Wegovy and diet.   Depression, major, single episode, mild (HCC) Not currently on medication. Mild symptoms on PHQ.  Partial  seizure [facility]) Managed by neurology. Well controlled on keppra.   Nausea Wegovy side effect. Zofran prn. -     ondansetron (ZOFRAN) 4 MG tablet; Take 1 tablet (4 mg total) by mouth every 8 (eight) hours as needed for nausea or vomiting.  Impingement syndrome of right shoulder Managed by ortho. Plans to start PT soon.   Peripheral edema Well controlled with HCTZ.   Return in about 3 months (around 09/28/2022) for CPE.   The patient indicates understanding of these issues and agrees with the plan.  Gabriel Earing, FNP

## 2022-07-05 ENCOUNTER — Other Ambulatory Visit: Payer: Self-pay | Admitting: Family Medicine

## 2022-07-05 DIAGNOSIS — F411 Generalized anxiety disorder: Secondary | ICD-10-CM

## 2022-07-05 DIAGNOSIS — F339 Major depressive disorder, recurrent, unspecified: Secondary | ICD-10-CM

## 2022-07-07 ENCOUNTER — Encounter: Payer: Self-pay | Admitting: Physical Therapy

## 2022-07-07 ENCOUNTER — Ambulatory Visit: Payer: BC Managed Care – PPO | Attending: Orthopedic Surgery | Admitting: Physical Therapy

## 2022-07-07 ENCOUNTER — Other Ambulatory Visit: Payer: Self-pay

## 2022-07-07 DIAGNOSIS — M25511 Pain in right shoulder: Secondary | ICD-10-CM | POA: Insufficient documentation

## 2022-07-07 DIAGNOSIS — M6281 Muscle weakness (generalized): Secondary | ICD-10-CM | POA: Insufficient documentation

## 2022-07-07 NOTE — Therapy (Addendum)
OUTPATIENT PHYSICAL THERAPY SHOULDER EVALUATION   Patient Name: Brandy Jefferson MRN: [id number] DOB:01/30/1976, 46 y.o., female Today's Date: 07/07/2022  END OF SESSION:  PT End of Session - 07/07/22 1319     Visit Number 1    Number of Visits 12    Date for PT Re-Evaluation 08/04/22    Authorization Type FOTO.    PT Start Time 1257    Activity Tolerance Patient tolerated treatment well    Behavior During Therapy [facility] for tasks assessed/performed             Past Medical History:  Diagnosis Date   Arteriovenous malformation of brain    Left temporal   Partial seizure (HCC) 12/07/2012   Plantar fasciitis    both feet   Seizures (HCC)    Partial seizures, right hemisensory   Past Surgical History:  Procedure Laterality Date   Arteriovenous malformation ablation  2004   Left temporal, intravascular procedure   MASS EXCISION N/A 09/29/2017   Procedure: EXCISION SEBACEOUS 2CM CYST ON BACK;  Surgeon: [name], MD;  Location: [facility];  Service: General;  Laterality: N/A;   Patient Active Problem List   Diagnosis Date Noted   Mixed hyperlipidemia 10/30/2021   Mild episode of recurrent major depressive disorder (HCC) 02/28/2021   Generalized anxiety disorder 02/28/2021   Pain in right knee 02/04/2019   Pain in left knee 08/23/2017   Morbid obesity (HCC) 07/28/2017   Partial seizure (HCC) 12/07/2012   History of arteriovenous malformation (AVM) 12/07/2012    REFERRING PROVIDER: Malon Kindle MD  REFERRING DIAG: Impingement syndrome of right shoulder.  THERAPY DIAG:  Acute pain of right shoulder - Plan: PT plan of care cert/re-cert  Muscle weakness (generalized) - Plan: PT plan of care cert/re-cert  Rationale for Evaluation and Treatment: Rehabilitation  ONSET DATE: ~September 2023.  SUBJECTIVE:                                                                                                                                                                                       SUBJECTIVE STATEMENT: The patient presents to the clinic with c/o right shoulder pain since September of this year with no known mechanism of injury.  She states today she is having a good day with a pain-level about a 3/10.  She works in Designer, fashion/clothing and performs repetitive arm motions.  She also has a 2 year old grandson that like to be lifted.  Depending on the day and the activity level her pain can rise to very high levels.  Rest can decrease her pain.  PERTINENT HISTORY: Seizures.  PAIN:  Are you having pain? Yes:  NPRS scale: 3/10 Pain location: Right shoulder. Pain description: Ache. Sharp Aggravating factors: As above  Relieving factors: As above.  PRECAUTIONS: None  WEIGHT BEARING RESTRICTIONS: No  FALLS:  Has patient fallen in last 6 months? No  LIVING ENVIRONMENT: Lives with: lives with their daughter Lives in: House/apartment Has following equipment at home:  none  OCCUPATION: Publishing copy.  PLOF: Independent  PATIENT GOALS:Not have right shoulder pain.  NEXT MD VISIT:   OBJECTIVE:   DIAGNOSTIC FINDINGS:  X-ray:  Trace acromioclavicular degenerative subchondral cysts.   PATIENT SURVEYS:  FOTO    POSTURE: Forward head and rounded shoulders.  UPPER EXTREMITY ROM:  Full active right shoulder motion today including normal behind the back motion.    UPPER EXTREMITY MMT:  Right shoulder flexion 4 to 4+/5 which appear limited due to pain.  IR/ER strength is a solid 4+/5.  SHOULDER SPECIAL TESTS: Impingement tests: Minimal pain reproduction. Biceps assessment: Speed's test: positive   PALPATION:  Tender to palpation over right bicipital groove, middle deltoid region and posterior cuff.   TODAY'S TREATMENT:                                                                                                                                         DATE: IFC at 80-150 Hz on 40% scan x 20 minutes.  Patient tolerated treatment without  complaint with normal modality response following removal of modality.   PATIENT EDUCATION: Education details: Short duration ice massage. Person educated: Patient Education method: Explanation Education comprehension: verbalized understanding    ASSESSMENT:  CLINICAL IMPRESSION: The patient presents to [facility] with c/o right shoulder pain that has been ongoing since about September of this year.  She is having a good day today.  Her right shoulder active range of motion is full.  She has some minimal strength deficits.  She is palpably tender over her right bicipital groove, middle deltoid and posterior musculature.  She had pain reproduction with a Speed's test.  Her pain can rise to very high levels with increased activity.  Patient will benefit from skilled physical therapy intervention to address pain and deficits.  OBJECTIVE IMPAIRMENTS: decreased activity tolerance, decreased strength, increased muscle spasms, and pain.   ACTIVITY LIMITATIONS: carrying, lifting, and reach over head  PARTICIPATION LIMITATIONS: cleaning, laundry, and occupation  REHAB POTENTIAL: Excellent  CLINICAL DECISION MAKING: Stable/uncomplicated  EVALUATION COMPLEXITY: Low   GOALS:  LONG TERM GOALS: Target date: 08/04/22  Ind with a HEP. Goal status: INITIAL  2.  Perform ADL's with right shoulder pain not > 2-3/10. Goal status: INITIAL  3.  Negative right shoulder Speed's test. Goal status: INITIAL  4.  Increase right shoulder strength to a solid 5/5 to increase stability for performance of functional activities. Goal status: INITIAL   PLAN:  PT FREQUENCY: 3x/week  PT DURATION: 4 weeks  PLANNED INTERVENTIONS: Therapeutic exercises, Therapeutic activity, Gait training, Patient/Family education,  Self Care, Dry Needling, Electrical stimulation, Cryotherapy, Moist heat, Taping, Vasopneumatic device, Ultrasound, and Manual therapy  PLAN FOR NEXT SESSION: Combo e'stim/US, STW/M, RW4, scapular  exercises, sdly ER.   Nolan Tuazon, [place], PT 07/07/2022, 2:24 PM

## 2022-07-09 ENCOUNTER — Encounter: Payer: Self-pay | Admitting: *Deleted

## 2022-07-09 ENCOUNTER — Ambulatory Visit: Payer: BC Managed Care – PPO | Admitting: *Deleted

## 2022-07-09 DIAGNOSIS — M25511 Pain in right shoulder: Secondary | ICD-10-CM | POA: Diagnosis not present

## 2022-07-09 DIAGNOSIS — M6281 Muscle weakness (generalized): Secondary | ICD-10-CM | POA: Diagnosis not present

## 2022-07-09 NOTE — Therapy (Signed)
OUTPATIENT PHYSICAL THERAPY SHOULDER EVALUATION   Patient Name: Brandy Jefferson MRN: [id number] DOB:Apr 14, 1976, 46 y.o., female Today's Date: 07/09/2022  END OF SESSION:  PT End of Session - 07/09/22 0946     Visit Number 2    Number of Visits 12    Date for PT Re-Evaluation 08/04/22    Authorization Type FOTO.    PT Start Time 0945    PT Stop Time 1035    PT Time Calculation (min) 50 min             Past Medical History:  Diagnosis Date   Arteriovenous malformation of brain    Left temporal   Partial seizure (HCC) 12/07/2012   Plantar fasciitis    both feet   Seizures (HCC)    Partial seizures, right hemisensory   Past Surgical History:  Procedure Laterality Date   Arteriovenous malformation ablation  2004   Left temporal, intravascular procedure   MASS EXCISION N/A 09/29/2017   Procedure: EXCISION SEBACEOUS 2CM CYST ON BACK;  Surgeon: [name], MD;  Location: [facility];  Service: General;  Laterality: N/A;   Patient Active Problem List   Diagnosis Date Noted   Mixed hyperlipidemia 10/30/2021   Mild episode of recurrent major depressive disorder (HCC) 02/28/2021   Generalized anxiety disorder 02/28/2021   Pain in right knee 02/04/2019   Pain in left knee 08/23/2017   Morbid obesity (HCC) 07/28/2017   Partial seizure (HCC) 12/07/2012   History of arteriovenous malformation (AVM) 12/07/2012    REFERRING PROVIDER: Malon Kindle MD  REFERRING DIAG: Impingement syndrome of right shoulder.  THERAPY DIAG:  Acute pain of right shoulder  Muscle weakness (generalized)  Rationale for Evaluation and Treatment: Rehabilitation  ONSET DATE: ~September 2023.  SUBJECTIVE:                                                                                                                                                                                      SUBJECTIVE STATEMENT: Pt arrived today with 3-4/10 RT shldr pain   PERTINENT HISTORY: Seizures.  PAIN:   Are you having pain? Yes: NPRS scale: 3-4/10 Pain location: Right shoulder. Pain description: Ache. Sharp Aggravating factors: As above  Relieving factors: As above.  PRECAUTIONS: None  WEIGHT BEARING RESTRICTIONS: No  FALLS:  Has patient fallen in last 6 months? No  LIVING ENVIRONMENT: Lives with: lives with their daughter Lives in: House/apartment Has following equipment at home:  none  OCCUPATION: Publishing copy.  PLOF: Independent  PATIENT GOALS:Not have right shoulder pain.  NEXT MD VISIT:   OBJECTIVE:   DIAGNOSTIC FINDINGS:  X-ray:  Trace acromioclavicular degenerative subchondral cysts.  PATIENT SURVEYS:  FOTO    POSTURE: Forward head and rounded shoulders.  UPPER EXTREMITY ROM:  Full active right shoulder motion today including normal behind the back motion.    UPPER EXTREMITY MMT:  Right shoulder flexion 4 to 4+/5 which appear limited due to pain.  IR/ER strength is a solid 4+/5.  SHOULDER SPECIAL TESTS: Impingement tests: Minimal pain reproduction. Biceps assessment: Speed's test: positive   PALPATION:  Tender to palpation over right bicipital groove, middle deltoid region and posterior cuff.   TODAY'S TREATMENT:     07-09-22                                                                                                                                    DATE:                                     EXERCISE LOG  Exercise Repetitions and Resistance Comments  RW4 Yellow 2x10 Cues for technique                   Blank cell = exercise not performed today  HEP: Yellow Tband and handout given for HEP  [place] /estim combo at 1.5 w/cm2 x 10 mins IFC at 80-150 Hz on 40% scan x 15 minutes.    PATIENT EDUCATION: Education details: Short duration ice massage. Person educated: Patient Education method: Explanation Education comprehension: verbalized understanding    ASSESSMENT:  CLINICAL IMPRESSION: The patient presents today with 3-4/10 RT  shldr soreness. RW 4 exercises performed with yellow tband and did well with verbal cues for technique. Tband and a handout were given for HEP. [place] combo was performed to posterior cuff and subacromial area and did well. IFC end of session.     OBJECTIVE IMPAIRMENTS: decreased activity tolerance, decreased strength, increased muscle spasms, and pain.   ACTIVITY LIMITATIONS: carrying, lifting, and reach over head  PARTICIPATION LIMITATIONS: cleaning, laundry, and occupation  REHAB POTENTIAL: Excellent  CLINICAL DECISION MAKING: Stable/uncomplicated  EVALUATION COMPLEXITY: Low   GOALS:  LONG TERM GOALS: Target date: 08/04/22  Ind with a HEP. Goal status: INITIAL  2.  Perform ADL's with right shoulder pain not > 2-3/10. Goal status: INITIAL  3.  Negative right shoulder Speed's test. Goal status: INITIAL  4.  Increase right shoulder strength to a solid 5/5 to increase stability for performance of functional activities. Goal status: INITIAL   PLAN:  PT FREQUENCY: 3x/week  PT DURATION: 4 weeks  PLANNED INTERVENTIONS: Therapeutic exercises, Therapeutic activity, Gait training, Patient/Family education, Self Care, Dry Needling, Electrical stimulation, Cryotherapy, Moist heat, Taping, Vasopneumatic device, Ultrasound, and Manual therapy  PLAN FOR NEXT SESSION: Combo e'stim/US, STW/M, RW4, scapular exercises, sdly ER.   Mikenzie Mccannon,CHRIS, PTA 07/09/2022, 10:42 AM

## 2022-07-15 ENCOUNTER — Ambulatory Visit: Payer: BC Managed Care – PPO | Attending: Orthopedic Surgery | Admitting: Physical Therapy

## 2022-07-15 DIAGNOSIS — M25511 Pain in right shoulder: Secondary | ICD-10-CM | POA: Diagnosis not present

## 2022-07-15 DIAGNOSIS — M6281 Muscle weakness (generalized): Secondary | ICD-10-CM | POA: Diagnosis not present

## 2022-07-15 NOTE — Therapy (Addendum)
OUTPATIENT PHYSICAL THERAPY SHOULDER EVALUATION   Patient Name: Brandy Jefferson MRN: [id number] DOB:March 29, 1976, 47 y.o., female Today's Date: 07/15/2022  END OF SESSION:  PT End of Session - 07/15/22 0938     Visit Number 3    Number of Visits 12    Date for PT Re-Evaluation 08/04/22    Authorization Type FOTO.    PT Start Time 0903    PT Stop Time 0955    PT Time Calculation (min) 52 min    Activity Tolerance Patient tolerated treatment well    Behavior During Therapy [facility] for tasks assessed/performed             Past Medical History:  Diagnosis Date   Arteriovenous malformation of brain    Left temporal   Partial seizure ([place]) 12/07/2012   Plantar fasciitis    both feet   Seizures (HCC)    Partial seizures, right hemisensory   Past Surgical History:  Procedure Laterality Date   Arteriovenous malformation ablation  2004   Left temporal, intravascular procedure   MASS EXCISION N/A 09/29/2017   Procedure: EXCISION SEBACEOUS 2CM CYST ON BACK;  Surgeon: Virl Cagey, MD;  Location: [facility];  Service: General;  Laterality: N/A;   Patient Active Problem List   Diagnosis Date Noted   Mixed hyperlipidemia 10/30/2021   Mild episode of recurrent major depressive disorder ([place]) 02/28/2021   Generalized anxiety disorder 02/28/2021   Pain in right knee 02/04/2019   Pain in left knee 08/23/2017   Morbid obesity ([place]) 07/28/2017   Partial seizure ([place]) 12/07/2012   History of arteriovenous malformation (AVM) 12/07/2012    REFERRING PROVIDER: Esmond Plants MD  REFERRING DIAG: Impingement syndrome of right shoulder.  THERAPY DIAG:  Acute pain of right shoulder  Muscle weakness (generalized)  Rationale for Evaluation and Treatment: Rehabilitation  ONSET DATE: ~September 2023.  SUBJECTIVE:                                                                                                                                                                                       SUBJECTIVE STATEMENT: Pt arrived today with shoulder feeling better.  Did okay at work yesterday.  PERTINENT HISTORY: Seizures.  PAIN:  Are you having pain? Yes: NPRS scale: 3/10 Pain location: Right shoulder. Pain description: Ache. Sharp Aggravating factors: As above  Relieving factors: As above.  PRECAUTIONS: None  WEIGHT BEARING RESTRICTIONS: No  FALLS:  Has patient fallen in last 6 months? No  LIVING ENVIRONMENT: Lives with: lives with their daughter Lives in: House/apartment Has following equipment at home:  none  OCCUPATION: Patent examiner.  PLOF: Independent  PATIENT GOALS:Not have right  shoulder pain.  NEXT MD VISIT:   OBJECTIVE:   DIAGNOSTIC FINDINGS:  X-ray:  Trace acromioclavicular degenerative subchondral cysts.   PATIENT SURVEYS:  FOTO    POSTURE: Forward head and rounded shoulders.  UPPER EXTREMITY ROM:  Full active right shoulder motion today including normal behind the back motion.    UPPER EXTREMITY MMT:  Right shoulder flexion 4 to 4+/5 which appear limited due to pain.  IR/ER strength is a solid 4+/5.  SHOULDER SPECIAL TESTS: Impingement tests: Minimal pain reproduction. Biceps assessment: Speed's test: positive   PALPATION:  Tender to palpation over right bicipital groove, middle deltoid region and posterior cuff.   TODAY'S TREATMENT:     07/15/22.                                                                                                                                    DATE:                                     EXERCISE LOG  Exercise Repetitions and Resistance Comments  UBE at 90 RPM's. 6 minutes   RW4  To fatigue with yellow band.                [place] /estim combo at 1.5 w/cm2 x 12 mins to patient's affected right shoulder. IFC at 80-150 Hz on 40% scan x 20 minutes.       ASSESSMENT:  CLINICAL IMPRESSION: Patient with lowered pain-level today and worked yesterday and right shoulder did well.  Excellent  technique with RW4 exercise. Provided patient with red theraband as well to progress resistance (ie:  IR and extension).    OBJECTIVE IMPAIRMENTS: decreased activity tolerance, decreased strength, increased muscle spasms, and pain.   ACTIVITY LIMITATIONS: carrying, lifting, and reach over head  PARTICIPATION LIMITATIONS: cleaning, laundry, and occupation  REHAB POTENTIAL: Excellent  CLINICAL DECISION MAKING: Stable/uncomplicated  EVALUATION COMPLEXITY: Low   GOALS:  LONG TERM GOALS: Target date: 08/04/22  Ind with a HEP. Goal status: INITIAL  2.  Perform ADL's with right shoulder pain not > 2-3/10. Goal status: INITIAL  3.  Negative right shoulder Speed's test. Goal status: INITIAL  4.  Increase right shoulder strength to a solid 5/5 to increase stability for performance of functional activities. Goal status: INITIAL   PLAN:  PT FREQUENCY: 3x/week  PT DURATION: 4 weeks  PLANNED INTERVENTIONS: Therapeutic exercises, Therapeutic activity, Gait training, Patient/Family education, Self Care, Dry Needling, Electrical stimulation, Cryotherapy, Moist heat, Taping, Vasopneumatic device, Ultrasound, and Manual therapy  PLAN FOR NEXT SESSION: Combo e'stim/US, STW/M, RW4, scapular exercises, sdly ER.   Byrd Rushlow, [place], PT 07/15/2022, 9:57 AM

## 2022-07-23 ENCOUNTER — Ambulatory Visit: Payer: BC Managed Care – PPO | Admitting: Physical Therapy

## 2022-07-23 DIAGNOSIS — M6281 Muscle weakness (generalized): Secondary | ICD-10-CM | POA: Diagnosis not present

## 2022-07-23 DIAGNOSIS — M25511 Pain in right shoulder: Secondary | ICD-10-CM

## 2022-07-23 NOTE — Therapy (Signed)
OUTPATIENT PHYSICAL THERAPY SHOULDER EVALUATION   Patient Name: Brandy Jefferson MRN: [id number] DOB:1976-04-02, 47 y.o., female Today's Date: 07/23/2022  END OF SESSION:  PT End of Session - 07/23/22 1020     Visit Number 4    Number of Visits 12    Date for PT Re-Evaluation 08/04/22    Authorization Type FOTO.    PT Start Time 0945    PT Stop Time 1038    PT Time Calculation (min) 53 min    Activity Tolerance Patient tolerated treatment well    Behavior During Therapy WFL for tasks assessed/performed             Past Medical History:  Diagnosis Date   Arteriovenous malformation of brain    Left temporal   Partial seizure ([place]) 12/07/2012   Plantar fasciitis    both feet   Seizures (HCC)    Partial seizures, right hemisensory   Past Surgical History:  Procedure Laterality Date   Arteriovenous malformation ablation  2004   Left temporal, intravascular procedure   MASS EXCISION N/A 09/29/2017   Procedure: EXCISION SEBACEOUS 2CM CYST ON BACK;  Surgeon: Virl Cagey, MD;  Location: [facility];  Service: General;  Laterality: N/A;   Patient Active Problem List   Diagnosis Date Noted   Mixed hyperlipidemia 10/30/2021   Mild episode of recurrent major depressive disorder ([place]) 02/28/2021   Generalized anxiety disorder 02/28/2021   Pain in right knee 02/04/2019   Pain in left knee 08/23/2017   Morbid obesity ([place]) 07/28/2017   Partial seizure ([place]) 12/07/2012   History of arteriovenous malformation (AVM) 12/07/2012    REFERRING PROVIDER: Esmond Plants MD  REFERRING DIAG: Impingement syndrome of right shoulder.  THERAPY DIAG:  Acute pain of right shoulder  Muscle weakness (generalized)  Rationale for Evaluation and Treatment: Rehabilitation  ONSET DATE: ~September 2023.  SUBJECTIVE:                                                                                                                                                                                       SUBJECTIVE STATEMENT: Doing better.  PERTINENT HISTORY: Seizures.  PAIN:  Are you having pain? Yes: NPRS scale: 2/10 Pain location: Right shoulder. Pain description: Ache. Sharp Aggravating factors: As above  Relieving factors: As above.  PRECAUTIONS: None  WEIGHT BEARING RESTRICTIONS: No  FALLS:  Has patient fallen in last 6 months? No  LIVING ENVIRONMENT: Lives with: lives with their daughter Lives in: House/apartment Has following equipment at home:  none  OCCUPATION: Patent examiner.  PLOF: Independent  PATIENT GOALS:Not have right shoulder pain.  NEXT MD VISIT:   OBJECTIVE:  DIAGNOSTIC FINDINGS:  X-ray:  Trace acromioclavicular degenerative subchondral cysts.   PATIENT SURVEYS:  FOTO    POSTURE: Forward head and rounded shoulders.  UPPER EXTREMITY ROM:  Full active right shoulder motion today including normal behind the back motion.    UPPER EXTREMITY MMT:  Right shoulder flexion 4 to 4+/5 which appear limited due to pain.  IR/ER strength is a solid 4+/5.  SHOULDER SPECIAL TESTS: Impingement tests: Minimal pain reproduction. Biceps assessment: Speed's test: positive   PALPATION:  Tender to palpation over right bicipital groove, middle deltoid region and posterior cuff.   TODAY'S TREATMENT:     07/23/22.                                                                                                                                    DATE:                                     EXERCISE LOG  Exercise Repetitions and Resistance Comments  UBE at 90 RPM's. 8 minutes   RW4  To fatigue with red band.                [place] /estim combo at 1.5 w/cm2 x 8 mins to patient's affected right shoulder f/b STW/M x 6 minutes with ischemic release technique utilized to right Infraspinatus.  IFC at 80-150 Hz on 40% scan x 20 minutes. Patient tolerated treatment without complaint with normal modality response following removal of modality.         ASSESSMENT:  CLINICAL IMPRESSION: Patient responding well with treatments.  Progressed to red theraband for all RW4 exercises without complaints.  Palpable trigger point in right Infraspinatus noted.  We discussed dry needling as a possible intervention.  Handout provided for patient.   OBJECTIVE IMPAIRMENTS: decreased activity tolerance, decreased strength, increased muscle spasms, and pain.   ACTIVITY LIMITATIONS: carrying, lifting, and reach over head  PARTICIPATION LIMITATIONS: cleaning, laundry, and occupation  REHAB POTENTIAL: Excellent  CLINICAL DECISION MAKING: Stable/uncomplicated  EVALUATION COMPLEXITY: Low   GOALS:  LONG TERM GOALS: Target date: 08/04/22  Ind with a HEP. Goal status: INITIAL  2.  Perform ADL's with right shoulder pain not > 2-3/10. Goal status: INITIAL  3.  Negative right shoulder Speed's test. Goal status: INITIAL  4.  Increase right shoulder strength to a solid 5/5 to increase stability for performance of functional activities. Goal status: INITIAL   PLAN:  PT FREQUENCY: 3x/week  PT DURATION: 4 weeks  PLANNED INTERVENTIONS: Therapeutic exercises, Therapeutic activity, Gait training, Patient/Family education, Self Care, Dry Needling, Electrical stimulation, Cryotherapy, Moist heat, Taping, Vasopneumatic device, Ultrasound, and Manual therapy  PLAN FOR NEXT SESSION: Combo e'stim/US, STW/M, RW4, scapular exercises, sdly ER.   Thao Bauza, [place], PT 07/23/2022, 11:02 AM

## 2022-07-28 ENCOUNTER — Telehealth: Payer: Self-pay | Admitting: Neurology

## 2022-07-28 NOTE — Telephone Encounter (Signed)
LVM and sent mychart msg informing pt of need to reschedule 12/02/22 appointment - NP out

## 2022-07-31 ENCOUNTER — Encounter: Payer: Self-pay | Admitting: Physical Therapy

## 2022-07-31 ENCOUNTER — Ambulatory Visit: Payer: BC Managed Care – PPO | Admitting: Physical Therapy

## 2022-07-31 DIAGNOSIS — M6281 Muscle weakness (generalized): Secondary | ICD-10-CM

## 2022-07-31 DIAGNOSIS — M25511 Pain in right shoulder: Secondary | ICD-10-CM

## 2022-07-31 NOTE — Therapy (Signed)
OUTPATIENT PHYSICAL THERAPY SHOULDER EVALUATION   Patient Name: Brandy Jefferson MRN: [id number] DOB:[birth year], 47 y.o., female Today's Date: 07/31/2022  END OF SESSION:  PT End of Session - 07/31/22 1227     Visit Number 5    Number of Visits 12    Date for PT Re-Evaluation 08/04/22    Authorization Type FOTO.    PT Start Time 0945    PT Stop Time 1035    PT Time Calculation (min) 50 min    Activity Tolerance Patient tolerated treatment well    Behavior During Therapy WFL for tasks assessed/performed             Past Medical History:  Diagnosis Date   Arteriovenous malformation of brain    Left temporal   Partial seizure ([place]) 12/07/2012   Plantar fasciitis    both feet   Seizures (HCC)    Partial seizures, right hemisensory   Past Surgical History:  Procedure Laterality Date   Arteriovenous malformation ablation  2004   Left temporal, intravascular procedure   MASS EXCISION N/A 09/29/2017   Procedure: EXCISION SEBACEOUS 2CM CYST ON BACK;  Surgeon: Virl Cagey, MD;  Location: [facility];  Service: General;  Laterality: N/A;   Patient Active Problem List   Diagnosis Date Noted   Mixed hyperlipidemia 10/30/2021   Mild episode of recurrent major depressive disorder ([place]) 02/28/2021   Generalized anxiety disorder 02/28/2021   Pain in right knee 02/04/2019   Pain in left knee 08/23/2017   Morbid obesity ([place]) 07/28/2017   Partial seizure ([place]) 12/07/2012   History of arteriovenous malformation (AVM) 12/07/2012    REFERRING PROVIDER: Esmond Plants MD  REFERRING DIAG: Impingement syndrome of right shoulder.  THERAPY DIAG:  Acute pain of right shoulder  Muscle weakness (generalized)  Rationale for Evaluation and Treatment: Rehabilitation  ONSET DATE: ~September 2023.  SUBJECTIVE:                                                                                                                                                                                       SUBJECTIVE STATEMENT: Shoulder is irritating.  Thinks due to cold weather.  PERTINENT HISTORY: Seizures.  PAIN:  Are you having pain? Yes: NPRS scale: No pain number provided/10 Pain location: Right shoulder. Pain description: Ache. Sharp Aggravating factors: As above  Relieving factors: As above.  PRECAUTIONS: None  WEIGHT BEARING RESTRICTIONS: No  FALLS:  Has patient fallen in last 6 months? No  LIVING ENVIRONMENT: Lives with: lives with their daughter Lives in: House/apartment Has following equipment at home:  none  OCCUPATION: Patent examiner.  PLOF: Independent  PATIENT GOALS:Not have right shoulder  pain.  NEXT MD VISIT:   OBJECTIVE:   DIAGNOSTIC FINDINGS:  X-ray:  Trace acromioclavicular degenerative subchondral cysts.   PATIENT SURVEYS:  FOTO    POSTURE: Forward head and rounded shoulders.  UPPER EXTREMITY ROM:  Full active right shoulder motion today including normal behind the back motion.    UPPER EXTREMITY MMT:  Right shoulder flexion 4 to 4+/5 which appear limited due to pain.  IR/ER strength is a solid 4+/5.  SHOULDER SPECIAL TESTS: Impingement tests: Minimal pain reproduction. Biceps assessment: Speed's test: positive   PALPATION:  Tender to palpation over right bicipital groove, middle deltoid region and posterior cuff.   TODAY'S TREATMENT:     07/23/22.                                                                                                                                    DATE:                                     EXERCISE LOG  Exercise Repetitions and Resistance Comments  UBE at 90 RPM's. 10 minutes   RW5   To fatigue with red band(added bicep curls)   Full can 1# to fatigue   Pulleys  3 minutes.   UE Ranger on wall 3 minutes into flexion, circles (CCW/CW).    HMP and IFC at 80-150 Hz on 40% scan x 20 minutes. Patient tolerated treatment without complaint with normal modality response following removal of  modality.        ASSESSMENT:  CLINICAL IMPRESSION: Patient reports right shoulder feel "irritating."  She feels it may be related to cold weather.  Disturbed sleep last night.  Added red theraband bicep curls and full can exercise with 1#.  She performed with excellent technique and no pain increase.  OBJECTIVE IMPAIRMENTS: decreased activity tolerance, decreased strength, increased muscle spasms, and pain.   ACTIVITY LIMITATIONS: carrying, lifting, and reach over head  PARTICIPATION LIMITATIONS: cleaning, laundry, and occupation  REHAB POTENTIAL: Excellent  CLINICAL DECISION MAKING: Stable/uncomplicated  EVALUATION COMPLEXITY: Low   GOALS:  LONG TERM GOALS: Target date: 08/04/22  Ind with a HEP. Goal status: INITIAL  2.  Perform ADL's with right shoulder pain not > 2-3/10. Goal status: INITIAL  3.  Negative right shoulder Speed's test. Goal status: INITIAL  4.  Increase right shoulder strength to a solid 5/5 to increase stability for performance of functional activities. Goal status: INITIAL   PLAN:  PT FREQUENCY: 3x/week  PT DURATION: 4 weeks  PLANNED INTERVENTIONS: Therapeutic exercises, Therapeutic activity, Gait training, Patient/Family education, Self Care, Dry Needling, Electrical stimulation, Cryotherapy, Moist heat, Taping, Vasopneumatic device, Ultrasound, and Manual therapy  PLAN FOR NEXT SESSION: Combo e'stim/US, STW/M, RW4, scapular exercises, sdly ER.   Darron Stuck, [place], PT 07/31/2022, 12:28 PM

## 2022-08-04 ENCOUNTER — Ambulatory Visit: Payer: BC Managed Care – PPO

## 2022-08-04 DIAGNOSIS — M6281 Muscle weakness (generalized): Secondary | ICD-10-CM

## 2022-08-04 DIAGNOSIS — M25511 Pain in right shoulder: Secondary | ICD-10-CM | POA: Diagnosis not present

## 2022-08-04 NOTE — Therapy (Signed)
OUTPATIENT PHYSICAL THERAPY SHOULDER TREATMENT   Patient Name: Brandy Jefferson MRN: [id number] DOB:05/11/1976, 47 y.o., female Today's Date: 08/04/2022  END OF SESSION:  PT End of Session - 08/04/22 1645     Visit Number 6    Number of Visits 12    Date for PT Re-Evaluation 08/04/22    Authorization Type FOTO.    PT Start Time 1645    PT Stop Time 1730    PT Time Calculation (min) 45 min    Activity Tolerance Patient tolerated treatment well    Behavior During Therapy WFL for tasks assessed/performed             Past Medical History:  Diagnosis Date   Arteriovenous malformation of brain    Left temporal   Partial seizure ([place]) 12/07/2012   Plantar fasciitis    both feet   Seizures (HCC)    Partial seizures, right hemisensory   Past Surgical History:  Procedure Laterality Date   Arteriovenous malformation ablation  2004   Left temporal, intravascular procedure   MASS EXCISION N/A 09/29/2017   Procedure: EXCISION SEBACEOUS 2CM CYST ON BACK;  Surgeon: Virl Cagey, MD;  Location: [facility];  Service: General;  Laterality: N/A;   Patient Active Problem List   Diagnosis Date Noted   Mixed hyperlipidemia 10/30/2021   Mild episode of recurrent major depressive disorder ([place]) 02/28/2021   Generalized anxiety disorder 02/28/2021   Pain in right knee 02/04/2019   Pain in left knee 08/23/2017   Morbid obesity ([place]) 07/28/2017   Partial seizure ([place]) 12/07/2012   History of arteriovenous malformation (AVM) 12/07/2012    REFERRING PROVIDER: Esmond Plants MD  REFERRING DIAG: Impingement syndrome of right shoulder.  THERAPY DIAG:  Acute pain of right shoulder  Muscle weakness (generalized)  Rationale for Evaluation and Treatment: Rehabilitation  ONSET DATE: ~September 2023.  SUBJECTIVE:                                                                                                                                                                                       SUBJECTIVE STATEMENT: Patient reports that her shoulder is tired today. She feels that her shoulder still feels about the same compared to starting therapy.   PERTINENT HISTORY: Seizures.  PAIN:  Are you having pain? Yes: NPRS scale: 2/10 Pain location: Right shoulder. Pain description: Ache. Sharp Aggravating factors: As above  Relieving factors: As above.  PRECAUTIONS: None  WEIGHT BEARING RESTRICTIONS: No  FALLS:  Has patient fallen in last 6 months? No  LIVING ENVIRONMENT: Lives with: lives with their daughter Lives in: House/apartment Has following equipment at home:  none  OCCUPATION:  [employer].  PLOF: Independent  PATIENT GOALS:Not have right shoulder pain.  NEXT MD VISIT:   OBJECTIVE:   DIAGNOSTIC FINDINGS:  X-ray:  Trace acromioclavicular degenerative subchondral cysts.   PATIENT SURVEYS:  FOTO    POSTURE: Forward head and rounded shoulders.  UPPER EXTREMITY ROM:  Full active right shoulder motion today including normal behind the back motion.    UPPER EXTREMITY MMT:  Right shoulder flexion 4 to 4+/5 which appear limited due to pain.  IR/ER strength is a solid 4+/5.  SHOULDER SPECIAL TESTS: Impingement tests: Minimal pain reproduction. Biceps assessment: Speed's test: positive   PALPATION:  Tender to palpation over right bicipital groove, middle deltoid region and posterior cuff.   TODAY'S TREATMENT:                                                                                                                                 DATE:                                     EXERCISE LOG  Exercise Repetitions and Resistance Comments  UBE X10 minutes @ 90 RPM   Self STM with ball     Resisted row  Blue XTS x 2 minutes   Resisted ER Red t-band x 2 minutes   Horizontal ABD stretch  3 x 30 seconds   Shoulder flexion stretch  3 x 30 seconds    Blank cell = exercise not performed today  Modalities: no redness or adverse reaction to any of  today's interventions  Date:  Unattended Estim: Shoulder, IFC @ 80-150 Hz w/ 40% scan, 10 mins, Pain                                    07/23/22 EXERCISE LOG  Exercise Repetitions and Resistance Comments  UBE at 90 RPM's. 10 minutes   RW5   To fatigue with red band(added bicep curls)   Full can 1# to fatigue   Pulleys  3 minutes.   UE Ranger on wall 3 minutes into flexion, circles (CCW/CW).    HMP and IFC at 80-150 Hz on 40% scan x 20 minutes. Patient tolerated treatment without complaint with normal modality response following removal of modality.   ASSESSMENT:  CLINICAL IMPRESSION: Patient was introduced to multiple new interventions for reduced pain and improved mobility with moderate effectiveness. She required minimal cueing with resisted external rotation to prevent shoulder abduction to isolate infraspinatus engagement. She reported no significant increase in pain or discomfort with any of today's interventions. She reported that her shoulder felt better upon the conclusion of treatment. However, she notes that the relief has only been temporary. She continues to require skilled physical therapy to address her remaining impairments to return to her prior level of function.  OBJECTIVE IMPAIRMENTS: decreased activity tolerance, decreased strength, increased muscle spasms, and pain.   ACTIVITY LIMITATIONS: carrying, lifting, and reach over head  PARTICIPATION LIMITATIONS: cleaning, laundry, and occupation  REHAB POTENTIAL: Excellent  CLINICAL DECISION MAKING: Stable/uncomplicated  EVALUATION COMPLEXITY: Low   GOALS:  LONG TERM GOALS: Target date: 08/04/22  Ind with a HEP. Goal status: INITIAL  2.  Perform ADL's with right shoulder pain not > 2-3/10. Goal status: INITIAL  3.  Negative right shoulder Speed's test. Goal status: INITIAL  4.  Increase right shoulder strength to a solid 5/5 to increase stability for performance of functional activities. Goal status:  INITIAL   PLAN:  PT FREQUENCY: 3x/week  PT DURATION: 4 weeks  PLANNED INTERVENTIONS: Therapeutic exercises, Therapeutic activity, Gait training, Patient/Family education, Self Care, Dry Needling, Electrical stimulation, Cryotherapy, Moist heat, Taping, Vasopneumatic device, Ultrasound, and Manual therapy  PLAN FOR NEXT SESSION: Combo e'stim/US, STW/M, RW4, scapular exercises, sdly ER.   Darlin Coco, PT 08/04/2022, 5:44 PM

## 2022-08-11 ENCOUNTER — Encounter: Payer: BC Managed Care – PPO | Admitting: *Deleted

## 2022-08-13 ENCOUNTER — Encounter: Payer: Self-pay | Admitting: Neurology

## 2022-09-30 ENCOUNTER — Ambulatory Visit (INDEPENDENT_AMBULATORY_CARE_PROVIDER_SITE_OTHER): Payer: BC Managed Care – PPO | Admitting: Family Medicine

## 2022-09-30 ENCOUNTER — Encounter: Payer: Self-pay | Admitting: Family Medicine

## 2022-09-30 VITALS — BP 109/74 | HR 70 | Temp 98.3°F | Ht 65.0 in | Wt 216.0 lb

## 2022-09-30 DIAGNOSIS — F320 Major depressive disorder, single episode, mild: Secondary | ICD-10-CM

## 2022-09-30 DIAGNOSIS — F411 Generalized anxiety disorder: Secondary | ICD-10-CM

## 2022-09-30 DIAGNOSIS — R609 Edema, unspecified: Secondary | ICD-10-CM

## 2022-09-30 DIAGNOSIS — Z6835 Body mass index (BMI) 35.0-35.9, adult: Secondary | ICD-10-CM

## 2022-09-30 DIAGNOSIS — R569 Unspecified convulsions: Secondary | ICD-10-CM

## 2022-09-30 DIAGNOSIS — Z0000 Encounter for general adult medical examination without abnormal findings: Secondary | ICD-10-CM

## 2022-09-30 DIAGNOSIS — E782 Mixed hyperlipidemia: Secondary | ICD-10-CM

## 2022-09-30 DIAGNOSIS — Z0001 Encounter for general adult medical examination with abnormal findings: Secondary | ICD-10-CM | POA: Diagnosis not present

## 2022-09-30 MED ORDER — HYDROCHLOROTHIAZIDE 12.5 MG PO TABS: 12.5000 mg | ORAL_TABLET | Freq: Every day | ORAL | 3 refills | Status: DC

## 2022-09-30 MED ORDER — WEGOVY 2.4 MG/0.75ML ~~LOC~~ SOAJ: 2.4000 mg | SUBCUTANEOUS | 6 refills | Status: DC

## 2022-09-30 NOTE — Patient Instructions (Signed)

## 2022-09-30 NOTE — Progress Notes (Unsigned)
Complete physical exam  Patient: Brandy Jefferson   DOB: 1976/02/02   47 y.o. Female  MRN: [id number]  Subjective:    Chief Complaint  Patient presents with   Annual Exam    Brandy Jefferson is a 47 y.o. female who presents today for a complete physical exam. She reports consuming a  well balanced  diet. The patient does not participate in regular exercise at present. She generally feels fairly well. She reports sleeping fairly well. She does not have additional problems to discuss today.   She has been taking Wegovy 2.4 mg every other week. She continues with a well balanced diet. She has not been exercising recently, but plans to start now that the weather is improving.   She continues to take HCTZ for peripheral edema with good relief.   Most recent fall risk assessment:    09/30/2022    3:53 PM  Barber in the past year? 0     Most recent depression screenings:    09/30/2022    3:57 PM 05/21/2022    9:44 AM 04/06/2022    3:44 PM  Depression screen PHQ 2/9  Decreased Interest 0 1 1  Down, Depressed, Hopeless 1 0 0  PHQ - 2 Score 1 1 1   Altered sleeping 1 1 1   Tired, decreased energy 1 1 1   Change in appetite 0 0 1  Feeling bad or failure about yourself  0 0 0  Trouble concentrating 0 0 1  Moving slowly or fidgety/restless 0 0 0  Suicidal thoughts 0 0 0  PHQ-9 Score 3 3 5   Difficult doing work/chores Not difficult at all Not difficult at all Not difficult at all      09/30/2022    3:58 PM 05/21/2022    9:45 AM 04/06/2022    3:45 PM 02/13/2022    8:44 AM  GAD 7 : Generalized Anxiety Score  Nervous, Anxious, on Edge 0 0 0 0  Control/stop worrying 1 0 0 0  Worry too much - different things 1 0 1 1  Trouble relaxing 0 1 1 0  Restless 0 1 0 0  Easily annoyed or irritable 1 1 1 1   Afraid - awful might happen 0 0 0 0  Total GAD 7 Score 3 3 3 2   Anxiety Difficulty Not difficult at all Not difficult at all Not difficult at all Not difficult at all       Vision:Not within last year  and Dental: No current dental problems and No regular dental care   Past Medical History:  Diagnosis Date   Arteriovenous malformation of brain    Left temporal   Partial seizure ([place]) 12/07/2012   Plantar fasciitis    both feet   Seizures (HCC)    Partial seizures, right hemisensory      Patient Care Team: Gwenlyn Perking, FNP as PCP - General (Family Medicine)   Outpatient Medications Prior to Visit  Medication Sig   cyclobenzaprine (FLEXERIL) 10 MG tablet Take 1 tablet (10 mg total) by mouth 3 (three) times daily as needed for muscle spasms.   diclofenac (VOLTAREN) 75 MG EC tablet Take 75 mg by mouth twice daily   hydrochlorothiazide (HYDRODIURIL) 12.5 MG tablet Take 1 tablet (12.5 mg total) by mouth daily.   levETIRAcetam (KEPPRA) 500 MG tablet Take 1 tablet in the morning, take 2 at night   norgestimate-ethinyl estradiol (ORTHO-CYCLEN,SPRINTEC,PREVIFEM) 0.25-35 MG-MCG tablet Take 1 tablet by mouth daily.  nystatin-triamcinolone ointment (MYCOLOG) APPLY TO AFFECTED AREA TWICE A DAY   ondansetron (ZOFRAN) 4 MG tablet Take 1 tablet (4 mg total) by mouth every 8 (eight) hours as needed for nausea or vomiting.   Semaglutide-Weight Management (WEGOVY) 2.4 MG/0.75ML SOAJ Inject 2.4 mg into the skin once a week.   No facility-administered medications prior to visit.    ROS Negative unless specially indicated above in HPI.     Objective:     BP 109/74   Pulse 70   Temp 98.3 F (36.8 C) (Temporal)   Ht 5\' 5"  (1.651 m)   Wt 216 lb (98 kg)   SpO2 98%   BMI 35.94 kg/m  Wt Readings from Last 3 Encounters:  09/30/22 216 lb (98 kg)  06/29/22 236 lb 8 oz (107.3 kg)  05/21/22 242 lb (109.8 kg)     Physical Exam Vitals and nursing note reviewed.  Constitutional:      General: She is not in acute distress.    Appearance: Normal appearance. She is obese. She is not ill-appearing.  HENT:     Head: Normocephalic and atraumatic.      Right Ear: Tympanic membrane, ear canal and external ear normal.     Left Ear: Tympanic membrane, ear canal and external ear normal.     Nose: Nose normal.     Mouth/Throat:     Mouth: Mucous membranes are moist.     Pharynx: Oropharynx is clear.  Eyes:     Extraocular Movements: Extraocular movements intact.     Conjunctiva/sclera: Conjunctivae normal.     Pupils: Pupils are equal, round, and reactive to light.  Neck:     Thyroid: No thyroid mass, thyromegaly or thyroid tenderness.  Cardiovascular:     Rate and Rhythm: Normal rate and regular rhythm.     Pulses: Normal pulses.     Heart sounds: Normal heart sounds. No murmur heard.    No friction rub. No gallop.  Pulmonary:     Effort: Pulmonary effort is normal.     Breath sounds: Normal breath sounds.  Abdominal:     General: Bowel sounds are normal. There is no distension.     Palpations: Abdomen is soft. There is no mass.     Tenderness: There is no abdominal tenderness. There is no guarding.  Musculoskeletal:     Cervical back: Normal range of motion and neck supple. No tenderness.     Right lower leg: No edema.     Left lower leg: No edema.  Skin:    General: Skin is warm and dry.     Capillary Refill: Capillary refill takes less than 2 seconds.     Findings: No lesion or rash.  Neurological:     General: No focal deficit present.     Mental Status: She is alert and oriented to person, place, and time.     Cranial Nerves: No cranial nerve deficit.     Motor: No weakness.     Gait: Gait normal.  Psychiatric:        Mood and Affect: Mood normal.        Behavior: Behavior normal.        Thought Content: Thought content normal.        Judgment: Judgment normal.      No results found for any visits on 09/30/22.     Assessment & Plan:    Routine Health Maintenance and Physical Exam  Brandy Jefferson was seen today for annual exam.  Diagnoses and all orders for this visit:  Routine general medical examination at a  health care facility She had labs done at her work a few months ago. She will upload a copy to Mychart for me to review.   Depression, major, single episode, mild (HCC) Generalized anxiety disorder Well controlled without medications.   Mixed hyperlipidemia Diet and exercise.   Partial seizure ([place]) On keppra. No recent seizures. Managed by neurology.   Peripheral edema Well controlled on current regimen.  -     hydrochlorothiazide (HYDRODIURIL) 12.5 MG tablet; Take 1 tablet (12.5 mg total) by mouth daily.  Morbid obesity ([place]) She has lost over 50 lbs since starting wegovy. Patient's BMI is >30 mg/m2.  Patient's current BMI is Body mass index is 35.94 kg/m.Marland Kitchen  Patient has lost and maintained a 5% body weight loss.  Patient is currently enrolled in a healthy eating plan along with encouraged exercise.   Patient does not have a personal or family history of medullary thyroid carcinoma (MTC) or Multiple Endocrine Neoplasia syndrome type 2 (MEN 2). -     Semaglutide-Weight Management (WEGOVY) 2.4 MG/0.75ML SOAJ; Inject 2.4 mg into the skin once a week.    Immunization History  Administered Date(s) Administered   Influenza, Quadrivalent, Recombinant, Inj, Pf 04/15/2019   Influenza,inj,Quad PF,6+ Mos 05/08/2013   Influenza-Unspecified 05/11/2014, 05/29/2015, 05/11/2016   Td 08/05/1992   Tdap 11/06/2020    Health Maintenance  Topic Date Due   INFLUENZA VACCINE  10/11/2022 (Originally 02/10/2022)   COVID-19 Vaccine (1) 03/02/2023 (Originally 04/29/1976)   COLONOSCOPY (Pts 45-70yrs Insurance coverage will need to be confirmed)  09/30/2023 (Originally 10/28/2020)   HIV Screening  09/30/2023 (Originally 10/29/1990)   PAP SMEAR-Modifier  08/14/2023   DTaP/Tdap/Td (3 - Td or Tdap) 11/07/2030   Hepatitis C Screening  Completed   HPV VACCINES  Aged Out    Discussed health benefits of physical activity, and encouraged her to engage in regular exercise appropriate for her age and  condition.  Problem List Items Addressed This Visit       Other   Partial seizure (HCC) (Chronic)   Morbid obesity (HCC)   Relevant Medications   Semaglutide-Weight Management (WEGOVY) 2.4 MG/0.75ML SOAJ   Generalized anxiety disorder   Mixed hyperlipidemia   Relevant Medications   hydrochlorothiazide (HYDRODIURIL) 12.5 MG tablet   Other Visit Diagnoses     Routine general medical examination at a health care facility    -  Primary   Depression, major, single episode, mild ([place])       Peripheral edema       Relevant Medications   hydrochlorothiazide (HYDRODIURIL) 12.5 MG tablet      Return in 1 year (on 09/30/2023).   The patient indicates understanding of these issues and agrees with the plan.  Gwenlyn Perking, FNP

## 2022-11-12 DIAGNOSIS — Z1231 Encounter for screening mammogram for malignant neoplasm of breast: Secondary | ICD-10-CM | POA: Diagnosis not present

## 2022-11-12 DIAGNOSIS — R319 Hematuria, unspecified: Secondary | ICD-10-CM | POA: Diagnosis not present

## 2022-11-12 DIAGNOSIS — Z6835 Body mass index (BMI) 35.0-35.9, adult: Secondary | ICD-10-CM | POA: Diagnosis not present

## 2022-11-12 DIAGNOSIS — Z01419 Encounter for gynecological examination (general) (routine) without abnormal findings: Secondary | ICD-10-CM | POA: Diagnosis not present

## 2022-11-16 ENCOUNTER — Other Ambulatory Visit: Payer: Self-pay | Admitting: Family Medicine

## 2022-11-16 ENCOUNTER — Other Ambulatory Visit: Payer: Self-pay | Admitting: Obstetrics and Gynecology

## 2022-11-16 DIAGNOSIS — R928 Other abnormal and inconclusive findings on diagnostic imaging of breast: Secondary | ICD-10-CM

## 2022-11-21 DIAGNOSIS — M549 Dorsalgia, unspecified: Secondary | ICD-10-CM | POA: Diagnosis not present

## 2022-11-21 DIAGNOSIS — M62838 Other muscle spasm: Secondary | ICD-10-CM | POA: Diagnosis not present

## 2022-11-21 DIAGNOSIS — M25512 Pain in left shoulder: Secondary | ICD-10-CM | POA: Diagnosis not present

## 2022-11-30 NOTE — Progress Notes (Unsigned)
PATIENT: Brandy Jefferson DOB: 08/26/1975  REASON FOR VISIT: follow up for seizures  HISTORY FROM: patient PRIMARY NEUROLOGIST: Dr.Willis/Dr. Teresa Coombs   HISTORY OF PRESENT ILLNESS: Today 12/01/22  Has lost 50 lbs with UUVOZD. Remains on Keppra 500/1000 mg daily. Under stress with family. She is caregiver for grandson. Described events as lightheaded dizzy for few seconds then goes away, then cannot respond for < 1 minute., 5-10 within the last 6 months. At the time was missing doses of her Keppra. Is not back to taking regularly. She thinks seizure triggered by missing medication. She had warning sign before seizure of lightheaded, last was a few weeks ago. Works in a Producer, television/film/video, puts spoils on machine. Doesn't climb any heights.  11/27/21 SS: Brandy Jefferson here today for follow-up.  Remains on Keppra 500/1000 mg daily.  No seizures, and around 6 years.  Her last seizure was triggered by missed dose of Keppra, brought on by bright lights.  Typical seizure event described as hands go numb, vision is distorted, drooling, stare off.  Her seizure history preceded ablation of left temporal AV malformation.  She works full-time in Producer, television/film/video.  Has started an injectable weight loss drug. Has a 75 year old grandson.  Update 10/30/2020 Brandy Jefferson is a 47 year old female with history of AV malformation in the left temporal area that was ablated.  Has history of seizures, on Keppra 500/1000 mg.  No recurrent seizures in around 5 years.  Continues to do well.  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  She is getting back on track with her follow-up appointments.  Presents today for evaluation unaccompanied.  Update 02/02/2019 SS: Brandy Jefferson is a 47 year old female with history of been arteriovenous malformation in the left temporal area that was ablated.  She has history of seizures and has done well taking Keppra 500 in the morning, 1000 mg in the evening. She is tolerating  the medication well. Her last seizure was 3-4 years ago. Her overall health as been good. Her arthritis is acting up, especially in her right shoulder. She works in Water engineer. She is driving a car without difficulty. She is on phentermine for weight loss.  She presents today for follow-up unaccompanied.  HISTORY 10/28/2017 Dr. Anne Hahn: Brandy Jefferson is a 47 year old right-handed white female with a history of an arteriovenous malformation in the left temporal area that was ablated, she has a history of seizures, she has done well on Keppra taking 500 mg in the morning and 1000 mg in the evening.  The patient tolerates the medication well without drowsiness or irritability.  She reports no seizures since last seen about 2 years ago.  The patient operates a motor vehicle without difficulty.  She recently had a cyst removed from her left shoulder but otherwise no new medical problems have been noted.  She returns to this office for an evaluation.   REVIEW OF SYSTEMS: Out of a complete 14 system review of symptoms, the patient complains only of the following symptoms, and all other reviewed systems are negative.  See HPI  ALLERGIES: No Known Allergies  HOME MEDICATIONS: Outpatient Medications Prior to Visit  Medication Sig Dispense Refill   cyclobenzaprine (FLEXERIL) 10 MG tablet Take 1 tablet (10 mg total) by mouth 3 (three) times daily as needed for muscle spasms. 60 tablet 0   hydrochlorothiazide (HYDRODIURIL) 12.5 MG tablet Take 1 tablet (12.5 mg total) by mouth daily. 90 tablet 3  norgestimate-ethinyl estradiol (ORTHO-CYCLEN,SPRINTEC,PREVIFEM) 0.25-35 MG-MCG tablet Take 1 tablet by mouth daily.     nystatin-triamcinolone ointment (MYCOLOG) APPLY TO AFFECTED AREA TWICE A DAY     ondansetron (ZOFRAN) 4 MG tablet Take 1 tablet (4 mg total) by mouth every 8 (eight) hours as needed for nausea or vomiting. 20 tablet 0   Semaglutide-Weight Management (WEGOVY) 2.4 MG/0.75ML SOAJ Inject 2.4 mg into the  skin once a week. 3 mL 6   levETIRAcetam (KEPPRA) 500 MG tablet Take 1 tablet in the morning, take 2 at night 270 tablet 4   diclofenac (VOLTAREN) 75 MG EC tablet Take 75 mg by mouth twice daily 60 tablet 1   No facility-administered medications prior to visit.    PAST MEDICAL HISTORY: Past Medical History:  Diagnosis Date   Arteriovenous malformation of brain    Left temporal   Partial seizure (HCC) 12/07/2012   Plantar fasciitis    both feet   Seizures (HCC)    Partial seizures, right hemisensory    PAST SURGICAL HISTORY: Past Surgical History:  Procedure Laterality Date   Arteriovenous malformation ablation  2004   Left temporal, intravascular procedure   MASS EXCISION N/A 09/29/2017   Procedure: EXCISION SEBACEOUS 2CM CYST ON BACK;  Surgeon: [name], MD;  Location: [facility];  Service: General;  Laterality: N/A;    FAMILY HISTORY: Family History  Problem Relation Age of Onset   Cancer Father     SOCIAL HISTORY: Social History   Socioeconomic History   Marital status: Single    Spouse name: Not on file   Number of children: 1   Years of education: 12   Highest education level: Not on file  Occupational History    Employer: [employer]  Tobacco Use   Smoking status: Never   Smokeless tobacco: Never  Vaping Use   Vaping Use: Never used  Substance and Sexual Activity   Alcohol use: No    Comment: Occasional   Drug use: No   Sexual activity: Yes    Birth control/protection: Pill  Other Topics Concern   Not on file  Social History Narrative   Patient lives at home with daughter and her daughters dad.    Patient is not married.    Patient has 1 child.    Patient is currently working.    Patient drinks about 5 cups of caffeine daily   Patient is right handed.      Social Determinants of Health   Financial Resource Strain: Not on file  Food Insecurity: Not on file  Transportation Needs: Not on file  Physical Activity: Not on file  Stress:  Not on file  Social Connections: Not on file  Intimate Partner Violence: Not on file   PHYSICAL EXAM  Vitals:   12/01/22 0959  BP: 125/75  Pulse: 80  Resp: (!) 97  Weight: 205 lb (93 kg)  Height: 5' 3.5" (1.613 m)   Body mass index is 35.74 kg/m.  Generalized: Well developed, in no acute distress  Neurological examination  Mentation: Alert oriented to time, place, history taking. Follows all commands speech and language fluent Cranial nerve II-XII: Pupils were equal round reactive to light. Extraocular movements were full, visual field were full on confrontational test. Facial sensation and strength were normal.  Head turning and shoulder shrug  were normal and symmetric. Motor: The motor testing reveals 5 over 5 strength of all 4 extremities.  Good symmetric motor tone is noted throughout.  Sensory: Sensory testing  is intact to soft touch on all 4 extremities. No evidence of extinction is noted.  Coordination: Cerebellar testing reveals good finger-nose-finger and heel-to-shin bilaterally.  Gait and station: Gait is normal.  Reflexes: Deep tendon reflexes are symmetric and normal bilaterally.   DIAGNOSTIC DATA (LABS, IMAGING, TESTING) - I reviewed patient records, labs, notes, testing and imaging myself where available.  Lab Results  Component Value Date   WBC 10.2 10/30/2021   HGB 13.5 10/30/2021   HCT 40.9 10/30/2021   MCV 88 10/30/2021   PLT 417 10/30/2021      Component Value Date/Time   NA 137 05/21/2022 0930   K 3.9 05/21/2022 0930   CL 99 05/21/2022 0930   CO2 22 05/21/2022 0930   GLUCOSE 83 05/21/2022 0930   GLUCOSE 96 09/21/2017 1504   BUN 10 05/21/2022 0930   CREATININE 0.74 05/21/2022 0930   CALCIUM 9.5 05/21/2022 0930   PROT 6.4 10/30/2021 1626   ALBUMIN 3.8 10/30/2021 1626   AST 21 10/30/2021 1626   ALT 17 10/30/2021 1626   ALKPHOS 58 10/30/2021 1626   BILITOT <0.2 10/30/2021 1626   GFRNONAA >60 09/21/2017 1504   GFRAA >60 09/21/2017 1504   Lab  Results  Component Value Date   CHOL 223 (H) 11/06/2020   HDL 82 11/06/2020   LDLCALC 129 (H) 11/06/2020   TRIG 71 11/06/2020   CHOLHDL 2.7 11/06/2020   No results found for: "HGBA1C" No results found for: "VITAMINB12" Lab Results  Component Value Date   TSH 2.040 10/30/2021    ASSESSMENT AND PLAN 47 y.o. year old female   1.  History of partial seizures 2.  Ablation of left temporal AV malformation  -Few recent small seizure type events in the setting of stress, medication noncompliance -We discussed increasing the dose of Keppra, she wishes to remain at current dose which is 500 mg in the morning, 1000 mg at night -Have discussed the importance of daily compliance, not to miss any doses, she is motivated, historically seizures under excellent control, none for 6 years prior -Check Keppra level today -If seizures continue, check EEG, increase dose, we talked about XR Keppra wants to stay with IR for now -Discussed [place] driving law is not to drive until seizure-free for 6 months  -Call for seizure events, follow-up 6 months or sooner if needed  Meds ordered this encounter  Medications   levETIRAcetam (KEPPRA) 500 MG tablet    Sig: Take 1 tablet in the morning, take 2 at night    Dispense:  270 tablet    Refill:  4   Margie Ege, [place], DNP 12/01/2022, 10:44 AM [facility] [address], [address] [place], [place] [address] [phone]

## 2022-12-01 ENCOUNTER — Ambulatory Visit
Admission: RE | Admit: 2022-12-01 | Discharge: 2022-12-01 | Disposition: A | Discharge status: Home / Self Care | Payer: BC Managed Care – PPO | Source: Ambulatory Visit | Attending: Obstetrics and Gynecology | Admitting: Obstetrics and Gynecology

## 2022-12-01 ENCOUNTER — Encounter: Payer: Self-pay | Admitting: Neurology

## 2022-12-01 ENCOUNTER — Ambulatory Visit: Payer: BC Managed Care – PPO | Admitting: Neurology

## 2022-12-01 VITALS — BP 125/75 | HR 80 | Resp 97 | Ht 63.5 in | Wt 205.0 lb

## 2022-12-01 DIAGNOSIS — R928 Other abnormal and inconclusive findings on diagnostic imaging of breast: Secondary | ICD-10-CM

## 2022-12-01 DIAGNOSIS — Z8774 Personal history of (corrected) congenital malformations of heart and circulatory system: Secondary | ICD-10-CM

## 2022-12-01 DIAGNOSIS — R569 Unspecified convulsions: Secondary | ICD-10-CM

## 2022-12-01 DIAGNOSIS — R92331 Mammographic heterogeneous density, right breast: Secondary | ICD-10-CM | POA: Diagnosis not present

## 2022-12-01 DIAGNOSIS — R922 Inconclusive mammogram: Secondary | ICD-10-CM | POA: Diagnosis not present

## 2022-12-01 MED ORDER — LEVETIRACETAM 500 MG PO TABS: ORAL_TABLET | ORAL | 4 refills | Status: DC

## 2022-12-01 NOTE — Patient Instructions (Signed)
Take Keppra daily not to miss any doses Call for continued seizures [place] driving law is no driving until seizure free 6 months  Check labs today  See you back in 6 months

## 2022-12-02 ENCOUNTER — Telehealth: Payer: BC Managed Care – PPO | Admitting: Neurology

## 2022-12-02 ENCOUNTER — Encounter: Payer: Self-pay | Admitting: Family Medicine

## 2022-12-02 ENCOUNTER — Encounter: Payer: Self-pay | Admitting: Obstetrics and Gynecology

## 2022-12-02 LAB — LEVETIRACETAM LEVEL: Levetiracetam Lvl: 22.5 ug/mL (ref 10.0–40.0)

## 2022-12-03 ENCOUNTER — Other Ambulatory Visit: Payer: BC Managed Care – PPO

## 2022-12-08 DIAGNOSIS — M7662 Achilles tendinitis, left leg: Secondary | ICD-10-CM | POA: Diagnosis not present

## 2023-01-21 ENCOUNTER — Ambulatory Visit: Payer: BC Managed Care – PPO | Admitting: Family Medicine

## 2023-01-21 ENCOUNTER — Encounter: Payer: Self-pay | Admitting: Family Medicine

## 2023-01-21 VITALS — BP 112/70 | HR 68 | Temp 97.3°F | Resp 20 | Ht 63.5 in | Wt 203.2 lb

## 2023-01-21 DIAGNOSIS — F320 Major depressive disorder, single episode, mild: Secondary | ICD-10-CM | POA: Insufficient documentation

## 2023-01-21 DIAGNOSIS — Z6835 Body mass index (BMI) 35.0-35.9, adult: Secondary | ICD-10-CM

## 2023-01-21 DIAGNOSIS — R3915 Urgency of urination: Secondary | ICD-10-CM

## 2023-01-21 DIAGNOSIS — N941 Unspecified dyspareunia: Secondary | ICD-10-CM

## 2023-01-21 DIAGNOSIS — R110 Nausea: Secondary | ICD-10-CM

## 2023-01-21 DIAGNOSIS — F411 Generalized anxiety disorder: Secondary | ICD-10-CM

## 2023-01-21 DIAGNOSIS — R6882 Decreased libido: Secondary | ICD-10-CM

## 2023-01-21 MED ORDER — ONDANSETRON HCL 4 MG PO TABS
4.0000 mg | ORAL_TABLET | Freq: Three times a day (TID) | ORAL | 1 refills | Status: DC | PRN
Start: 2023-01-21 — End: 2023-05-07

## 2023-01-21 NOTE — Progress Notes (Signed)
Established Patient Office Visit  Subjective   Patient ID: Brandy Jefferson, female    DOB: 04-09-1976  Age: 47 y.o. MRN: [id number]  Chief Complaint  Patient presents with   Medical Management of Chronic Issues    HPI Brandy Jefferson is here for a weight check. She reprots doing well with Wegovy 2.4 mg overall. She has been doing her injections every other week as she struggles to eat enough if she does the injection weekly. She has some nausea the first few days after the injection but denies other side effects. She would like a refill for zofran prn for this.   She reports that anxiety is well controlled over all without medication.   She does reports decreased libido for the last 6-7 months. She does sometimes have vaginal dryness and pain with intercourse. She has seen her GYN recently and had a normal pelvic exam. She does also endorse urinary urgency and frequency that is intermittent.      01/21/2023    3:34 PM 09/30/2022    3:57 PM 05/21/2022    9:44 AM 04/06/2022    3:44 PM 02/13/2022    8:43 AM  Depression screen PHQ 2/9  Decreased Interest 1 0 1 1 1   Down, Depressed, Hopeless 0 1 0 0 1  PHQ - 2 Score 1 1 1 1 2   Altered sleeping 1 1 1 1 2   Tired, decreased energy 1 1 1 1 2   Change in appetite 0 0 0 1 0  Feeling bad or failure about yourself  1 0 0 0 0  Trouble concentrating 1 0 0 1 1  Moving slowly or fidgety/restless 0 0 0 0 0  Suicidal thoughts 0 0 0 0 0  PHQ-9 Score 5 3 3 5 7   Difficult doing work/chores Not difficult at all Not difficult at all Not difficult at all Not difficult at all Not difficult at all      01/21/2023    3:34 PM 09/30/2022    3:58 PM 05/21/2022    9:45 AM 04/06/2022    3:45 PM  GAD 7 : Generalized Anxiety Score  Nervous, Anxious, on Edge 0 0 0 0  Control/stop worrying 0 1 0 0  Worry too much - different things 1 1 0 1  Trouble relaxing 0 0 1 1  Restless 0 0 1 0  Easily annoyed or irritable 1 1 1 1   Afraid - awful might happen 0 0 0 0  Total  GAD 7 Score 2 3 3 3   Anxiety Difficulty Not difficult at all Not difficult at all Not difficult at all Not difficult at all       ROS As per HPI.   Objective:     BP 112/70   Pulse 68   Temp (!) 97.3 F (36.3 C) (Oral)   Resp 20   Ht 5' 3.5" (1.613 m)   Wt 203 lb 4 oz (92.2 kg)   SpO2 97%   BMI 35.44 kg/m  Wt Readings from Last 3 Encounters:  01/21/23 203 lb 4 oz (92.2 kg)  12/01/22 205 lb (93 kg)  09/30/22 216 lb (98 kg)    Physical Exam Vitals and nursing note reviewed.  Constitutional:      General: She is not in acute distress.    Appearance: She is obese. She is not ill-appearing, toxic-appearing or diaphoretic.  Neck:     Thyroid: No thyroid mass, thyromegaly or thyroid tenderness.  Cardiovascular:     Rate  and Rhythm: Normal rate and regular rhythm.     Pulses: Normal pulses.     Heart sounds: Normal heart sounds. No murmur heard. Pulmonary:     Effort: Pulmonary effort is normal. No respiratory distress.     Breath sounds: Normal breath sounds.  Musculoskeletal:     Cervical back: Neck supple. No tenderness.     Right lower leg: No edema.     Left lower leg: No edema.  Lymphadenopathy:     Cervical: No cervical adenopathy.  Skin:    General: Skin is warm and dry.  Neurological:     General: No focal deficit present.     Mental Status: She is alert and oriented to person, place, and time.  Psychiatric:        Mood and Affect: Mood normal.        Behavior: Behavior normal.      No results found for any visits on 01/21/23.    The 10-year ASCVD risk score (Arnett DK, et al., 2019) is: 0.5%    Assessment & Plan:   Brandy Jefferson was seen today for medical management of chronic issues.  Diagnoses and all orders for this visit:  Class 2 severe obesity due to excess calories with serious comorbidity and body mass index (BMI) of 35.0 to 35.9 in adult (HCC) Patient's BMI is >30 mg/m2.  Patient's current BMI is Body mass index is 35.44 kg/m.Marland Kitchen   Patient has lost and maintained a 5% body weight loss.  Patient is currently enrolled in a healthy eating plan along with encouraged exercise.   Patient does not have a personal or family history of medullary thyroid carcinoma (MTC) or Multiple Endocrine Neoplasia syndrome type 2 (MEN 2).  Depression, major, single episode, mild (HCC) Generalized anxiety disorder Controlled without medication  Nausea Refill provided.  -     ondansetron (ZOFRAN) 4 MG tablet; Take 1 tablet (4 mg total) by mouth every 8 (eight) hours as needed for nausea or vomiting.  Decreased libido Pain in female genitalia on intercourse Urinary urgency Has had recent GYN appt and exam. Will try pelvic floor therapy.  -     Ambulatory referral to Physical Therapy    Return in about 3 months (around 04/23/2023) for chronic follow up .   The patient indicates understanding of these issues and agrees with the plan.  Brandy Earing, FNP

## 2023-05-07 ENCOUNTER — Encounter: Payer: Self-pay | Admitting: Family Medicine

## 2023-05-07 ENCOUNTER — Ambulatory Visit: Payer: BC Managed Care – PPO | Admitting: Family Medicine

## 2023-05-07 VITALS — BP 114/76 | HR 62 | Temp 98.2°F | Ht 63.5 in | Wt 201.1 lb

## 2023-05-07 DIAGNOSIS — R110 Nausea: Secondary | ICD-10-CM | POA: Diagnosis not present

## 2023-05-07 DIAGNOSIS — Z6835 Body mass index (BMI) 35.0-35.9, adult: Secondary | ICD-10-CM | POA: Diagnosis not present

## 2023-05-07 DIAGNOSIS — E66812 Obesity, class 2: Secondary | ICD-10-CM

## 2023-05-07 DIAGNOSIS — F330 Major depressive disorder, recurrent, mild: Secondary | ICD-10-CM | POA: Diagnosis not present

## 2023-05-07 LAB — BAYER DCA HB A1C WAIVED: HB A1C (BAYER DCA - WAIVED): 4.8 % (ref 4.8–5.6)

## 2023-05-07 MED ORDER — TIRZEPATIDE-WEIGHT MANAGEMENT 5 MG/0.5ML ~~LOC~~ SOLN
5.0000 mg | SUBCUTANEOUS | 1 refills | Status: DC
Start: 2023-05-07 — End: 2023-09-10

## 2023-05-07 MED ORDER — ONDANSETRON HCL 4 MG PO TABS: 4.0000 mg | ORAL_TABLET | Freq: Three times a day (TID) | ORAL | 1 refills | Status: AC | PRN

## 2023-05-07 MED ORDER — TIRZEPATIDE-WEIGHT MANAGEMENT 2.5 MG/0.5ML ~~LOC~~ SOLN
2.5000 mg | SUBCUTANEOUS | 1 refills | Status: DC
Start: 2023-05-07 — End: 2023-09-10

## 2023-05-07 NOTE — Progress Notes (Signed)
Established Patient Office Visit  Subjective   Patient ID: Brandy Jefferson, female    DOB: 12-28-1975  Age: 47 y.o. MRN: [id number]  Chief Complaint  Patient presents with   Obesity    HPI Here for weight management follow up. She has continued on wegovy 2.4 mg. She is having significantly decreased appetite, nausea, and diarrhea. She is not eating much at all because of these symptoms. She would like to discuss lowering her dosage vs trying another medication. Weight continues to trend down. Starting weight about 269 lbs. Weight continues to trend down. Fasting for labs.      05/07/2023   11:13 AM 01/21/2023    3:34 PM 09/30/2022    3:57 PM  Depression screen PHQ 2/9  Decreased Interest 1 1 0  Down, Depressed, Hopeless 1 0 1  PHQ - 2 Score 2 1 1   Altered sleeping 0 1 1  Tired, decreased energy 0 1 1  Change in appetite 0 0 0  Feeling bad or failure about yourself  0 1 0  Trouble concentrating 0 1 0  Moving slowly or fidgety/restless 0 0 0  Suicidal thoughts 0 0 0  PHQ-9 Score 2 5 3   Difficult doing work/chores Not difficult at all Not difficult at all Not difficult at all      01/21/2023    3:34 PM 09/30/2022    3:58 PM 05/21/2022    9:45 AM 04/06/2022    3:45 PM  GAD 7 : Generalized Anxiety Score  Nervous, Anxious, on Edge 0 0 0 0  Control/stop worrying 0 1 0 0  Worry too much - different things 1 1 0 1  Trouble relaxing 0 0 1 1  Restless 0 0 1 0  Easily annoyed or irritable 1 1 1 1   Afraid - awful might happen 0 0 0 0  Total GAD 7 Score 2 3 3 3   Anxiety Difficulty Not difficult at all Not difficult at all Not difficult at all Not difficult at all       ROS As per HPI.    Objective:     BP 114/76   Pulse 62   Temp 98.2 F (36.8 C) (Temporal)   Ht 5' 3.5" (1.613 m)   Wt 201 lb 2 oz (91.2 kg)   SpO2 100%   BMI 35.07 kg/m  Wt Readings from Last 3 Encounters:  05/07/23 201 lb 2 oz (91.2 kg)  01/21/23 203 lb 4 oz (92.2 kg)  12/01/22 205 lb (93 kg)       Physical Exam Vitals and nursing note reviewed.  Constitutional:      General: She is not in acute distress.    Appearance: She is obese. She is not ill-appearing, toxic-appearing or diaphoretic.  Cardiovascular:     Rate and Rhythm: Normal rate and regular rhythm.     Heart sounds: Normal heart sounds. No murmur heard. Pulmonary:     Effort: Pulmonary effort is normal.     Breath sounds: Normal breath sounds.  Musculoskeletal:     Right lower leg: No edema.     Left lower leg: No edema.  Skin:    General: Skin is warm and dry.  Neurological:     General: No focal deficit present.     Mental Status: She is alert and oriented to person, place, and time.  Psychiatric:        Mood and Affect: Mood normal.        Behavior: Behavior  normal.      No results found for any visits on 05/07/23.    The 10-year ASCVD risk score (Arnett DK, et al., 2019) is: 0.5%    Assessment & Plan:   Tiyona was seen today for obesity.  Diagnoses and all orders for this visit:  Class 2 severe obesity due to excess calories with serious comorbidity and body mass index (BMI) of 35.0 to 35.9 in adult [facility]) She has lost 60 lbs with weogovy, diet, and exercise. She is having significant side effects with wegovy however. Will transition to zepbound if covered by insurance. Discussed decreased to wegovy 1.7 mg if zepbound not covered. Fasting labs pending.  -     tirzepatide (ZEPBOUND) 2.5 MG/0.5ML injection vial; Inject 2.5 mg into the skin once a week. -     tirzepatide 5 MG/0.5ML injection vial; Inject 5 mg into the skin once a week. -     CBC with Differential/Platelet -     CMP14+EGFR -     Lipid panel -     TSH -     Bayer DCA Hb A1c Waived  Nausea Zofran prn.  -     ondansetron (ZOFRAN) 4 MG tablet; Take 1 tablet (4 mg total) by mouth every 8 (eight) hours as needed for nausea or vomiting.   Mild episode of recurrent major depressive disorder (HCC) Stable. Declines treatment  currently.    Return in about 3 months (around 08/07/2023) for chronic follow up.   The patient indicates understanding of these issues and agrees with the plan.  Gabriel Earing, FNP

## 2023-05-08 LAB — CBC WITH DIFFERENTIAL/PLATELET
Basophils Absolute: 0.1 10*3/uL (ref 0.0–0.2)
Basos: 1 %
Eos: 2 %
Hematocrit: 42.6 % (ref 34.0–46.6)
Hemoglobin: 13.7 g/dL (ref 11.1–15.9)
Immature Grans (Abs): 0 10*3/uL (ref 0.0–0.1)
Immature Granulocytes: 0 %
Lymphocytes Absolute: 2.1 10*3/uL (ref 0.7–3.1)
Lymphs: 26 %
MCH: 29.2 pg (ref 26.6–33.0)
MCHC: 32.2 g/dL (ref 31.5–35.7)
MCV: 91 fL (ref 79–97)
Monocytes Absolute: 0.2 10*3/uL (ref 0.0–0.4)
Monocytes Absolute: 0.5 10*3/uL (ref 0.1–0.9)
Monocytes: 7 %
Neutrophils Absolute: 5.1 10*3/uL (ref 1.4–7.0)
Neutrophils: 64 %
Platelets: 421 10*3/uL (ref 150–450)
RBC: 4.69 x10E6/uL (ref 3.77–5.28)
RDW: 11.9 % (ref 11.7–15.4)
WBC: 7.9 10*3/uL (ref 3.4–10.8)

## 2023-05-08 LAB — CMP14+EGFR
ALT: 15 IU/L (ref 0–32)
AST: 21 [IU]/L (ref 0–40)
Albumin: 3.8 g/dL — ABNORMAL LOW (ref 3.9–4.9)
Alkaline Phosphatase: 63 IU/L (ref 44–121)
BUN/Creatinine Ratio: 20 (ref 9–23)
BUN: 14 mg/dL (ref 6–24)
Bilirubin Total: 0.3 mg/dL (ref 0.0–1.2)
CO2: 24 mmol/L (ref 20–29)
Calcium: 8.9 mg/dL (ref 8.7–10.2)
Chloride: 102 mmol/L (ref 96–106)
Creatinine, Ser: 0.71 mg/dL (ref 0.57–1.00)
Globulin, Total: 2.5 g/dL (ref 1.5–4.5)
Glucose: 71 mg/dL (ref 70–99)
Potassium: 4.4 mmol/L (ref 3.5–5.2)
Sodium: 137 mmol/L (ref 134–144)
Total Protein: 6.3 g/dL (ref 6.0–8.5)
eGFR: 105 mL/min/{1.73_m2} (ref >59)

## 2023-05-08 LAB — LIPID PANEL
Chol/HDL Ratio: 2.6 ratio (ref 0.0–4.4)
Cholesterol, Total: 196 mg/dL (ref 100–199)
HDL: 76 mg/dL (ref >39)
LDL Chol Calc (NIH): 103 mg/dL — ABNORMAL HIGH (ref 0–99)
Triglycerides: 94 mg/dL (ref 0–149)
VLDL Cholesterol Cal: 17 mg/dL (ref 5–40)

## 2023-05-08 LAB — TSH: TSH: 2.04 u[IU]/mL (ref 0.450–4.500)

## 2023-05-11 ENCOUNTER — Other Ambulatory Visit (HOSPITAL_COMMUNITY): Payer: Self-pay

## 2023-05-11 ENCOUNTER — Telehealth: Payer: Self-pay

## 2023-05-11 NOTE — Telephone Encounter (Signed)
Pharmacy Patient Advocate Encounter   Received notification from Physician's Office that prior authorization for Zepbound is required/requested.   Insurance verification completed.   The patient is insured through [facility] .   Per test claim: PA required; PA submitted to above mentioned insurance via CoverMyMeds Key/confirmation #/EOC Key: [id number] Status is pending

## 2023-05-12 NOTE — Telephone Encounter (Signed)
Clinical questions completed and submitted to [facility]

## 2023-05-13 ENCOUNTER — Encounter: Payer: Self-pay | Admitting: Family Medicine

## 2023-05-14 ENCOUNTER — Other Ambulatory Visit (HOSPITAL_COMMUNITY): Payer: Self-pay

## 2023-05-14 NOTE — Telephone Encounter (Signed)
Pharmacy Patient Advocate Encounter  Received notification from [facility] that Prior Authorization for Zepbound has been APPROVED from 10.30.24 to 3.5.25. I have called the pts preferred pharmacy and the copay is 60.61for a 22month supply. This test claim was processed through [facility]- copay amounts may vary at other pharmacies due to pharmacy/plan contracts, or as the patient moves through the different stages of their insurance plan.   PA #/Case ID/Reference #: [id number]

## 2023-06-15 NOTE — Progress Notes (Unsigned)
PATIENT: Brandy Jefferson DOB: 02/20/1976  REASON FOR VISIT: follow up for seizures  HISTORY FROM: patient PRIMARY NEUROLOGIST: Dr.Willis/Dr. Teresa Coombs   HISTORY OF PRESENT ILLNESS: Today 06/16/23  Remains on Keppra 500/1000 mg daily. Describes a few spells of seizure activity, where she can't speak, is still aware, is not focused, lasts less than 1 minute. Has been similar to prior seizure spells. Has missed few nighttime doses of Keppra. Unable to say if seizure spells correlated?  Seizure spells often during time of stress/anxiety.  She is raising her 45-year-old grandson.  Has lost 50 lbs with weight loss injection. Keppra level was 22.5 in May 2024.   12/01/22 SS: Has lost 50 lbs with WGNFAO. Remains on Keppra 500/1000 mg daily. Under stress with family. She is caregiver for grandson. Described events as lightheaded dizzy for few seconds then goes away, then cannot respond for < 1 minute., 5-10 within the last 6 months. At the time was missing doses of her Keppra. Is not back to taking regularly. She thinks seizure triggered by missing medication. She had warning sign before seizure of lightheaded, last was a few weeks ago. Works in a Producer, television/film/video, puts spoils on machine. Doesn't climb any heights.  11/27/21 SS: Brandy Jefferson here today for follow-up.  Remains on Keppra 500/1000 mg daily.  No seizures, and around 6 years.  Her last seizure was triggered by missed dose of Keppra, brought on by bright lights.  Typical seizure event described as hands go numb, vision is distorted, drooling, stare off.  Her seizure history preceded ablation of left temporal AV malformation.  She works full-time in Producer, television/film/video.  Has started an injectable weight loss drug. Has a 12 year old grandson.  Update 10/30/2020 Brandy Jefferson is a 47 year old female with history of AV malformation in the left temporal area that was ablated.  Has history of seizures, on Keppra 500/1000 mg.  No recurrent seizures in around  5 years.  Continues to do well.  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  She is getting back on track with her follow-up appointments.  Presents today for evaluation unaccompanied.  Update 02/02/2019 SS: Brandy Jefferson is a 47 year old female with history of been arteriovenous malformation in the left temporal area that was ablated.  She has history of seizures and has done well taking Keppra 500 in the morning, 1000 mg in the evening. She is tolerating the medication well. Her last seizure was 3-4 years ago. Her overall health as been good. Her arthritis is acting up, especially in her right shoulder. She works in Water engineer. She is driving a car without difficulty. She is on phentermine for weight loss.  She presents today for follow-up unaccompanied.  HISTORY 10/28/2017 Dr. Anne Jefferson: Brandy Jefferson is a 47 year old right-handed white female with a history of an arteriovenous malformation in the left temporal area that was ablated, she has a history of seizures, she has done well on Keppra taking 500 mg in the morning and 1000 mg in the evening.  The patient tolerates the medication well without drowsiness or irritability.  She reports no seizures since last seen about 2 years ago.  The patient operates a motor vehicle without difficulty.  She recently had a cyst removed from her left shoulder but otherwise no new medical problems have been noted.  She returns to this office for an evaluation.   REVIEW OF SYSTEMS: Out of a complete 14 system review of symptoms,  the patient complains only of the following symptoms, and all other reviewed systems are negative.  See HPI  ALLERGIES: No Known Allergies  HOME MEDICATIONS: Outpatient Medications Prior to Visit  Medication Sig Dispense Refill   cyclobenzaprine (FLEXERIL) 10 MG tablet Take 1 tablet (10 mg total) by mouth 3 (three) times daily as needed for muscle spasms. 60 tablet 0   hydrochlorothiazide (HYDRODIURIL) 12.5 MG tablet Take 1  tablet (12.5 mg total) by mouth daily. 90 tablet 3   levETIRAcetam (KEPPRA) 500 MG tablet Take 1 tablet in the morning, take 2 at night 270 tablet 4   norgestimate-ethinyl estradiol (ORTHO-CYCLEN,SPRINTEC,PREVIFEM) 0.25-35 MG-MCG tablet Take 1 tablet by mouth daily.     nystatin-triamcinolone ointment (MYCOLOG) APPLY TO AFFECTED AREA TWICE A DAY     ondansetron (ZOFRAN) 4 MG tablet Take 1 tablet (4 mg total) by mouth every 8 (eight) hours as needed for nausea or vomiting. 20 tablet 1   tirzepatide (ZEPBOUND) 2.5 MG/0.5ML injection vial Inject 2.5 mg into the skin once a week. 2 mL 1   tirzepatide 5 MG/0.5ML injection vial Inject 5 mg into the skin once a week. (Patient taking differently: Inject 5 mg into the skin once a week. Will titrate up) 2 mL 1   Semaglutide-Weight Management (WEGOVY) 2.4 MG/0.75ML SOAJ Inject 2.4 mg into the skin once a week. 3 mL 6   No facility-administered medications prior to visit.    PAST MEDICAL HISTORY: Past Medical History:  Diagnosis Date   Arteriovenous malformation of brain    Left temporal   Partial seizure (HCC) 12/07/2012   Plantar fasciitis    both feet   Seizures (HCC)    Partial seizures, right hemisensory    PAST SURGICAL HISTORY: Past Surgical History:  Procedure Laterality Date   Arteriovenous malformation ablation  2004   Left temporal, intravascular procedure   MASS EXCISION N/A 09/29/2017   Procedure: EXCISION SEBACEOUS 2CM CYST ON BACK;  Surgeon: [name], MD;  Location: [facility];  Service: General;  Laterality: N/A;    FAMILY HISTORY: Family History  Problem Relation Age of Onset   Cancer Father     SOCIAL HISTORY: Social History   Socioeconomic History   Marital status: Single    Spouse name: Not on file   Number of children: 1   Years of education: 12   Highest education level: 12th grade  Occupational History    Employer: [employer]  Tobacco Use   Smoking status: Never   Smokeless tobacco: Never   Vaping Use   Vaping status: Never Used  Substance and Sexual Activity   Alcohol use: No    Comment: Occasional   Drug use: No   Sexual activity: Yes    Birth control/protection: Pill  Other Topics Concern   Not on file  Social History Narrative   Patient lives at home with daughter and her daughters dad.    Patient is not married.    Patient has 1 child.    Patient is currently working.    Patient drinks about 5 cups of caffeine daily   Patient is right handed.      Social Determinants of Health   Financial Resource Strain: Patient Declined (05/06/2023)   Overall Financial Resource Strain (CARDIA)    Difficulty of Paying Living Expenses: Patient declined  Food Insecurity: Patient Declined (05/06/2023)   Hunger Vital Sign    Worried About Running Out of Food in the Last Year: Patient declined    Ran  Out of Food in the Last Year: Patient declined  Transportation Needs: No Transportation Needs (05/06/2023)   PRAPARE - Administrator, Civil Service (Medical): No    Lack of Transportation (Non-Medical): No  Physical Activity: Unknown (05/06/2023)   Exercise Vital Sign    Days of Exercise per Week: Patient declined    Minutes of Exercise per Session: Not on file  Stress: Stress Concern Present (05/06/2023)   [employer] of Occupational Health - Occupational Stress Questionnaire    Feeling of Stress : To some extent  Social Connections: Unknown (05/06/2023)   Social Connection and Isolation Panel [NHANES]    Frequency of Communication with Friends and Family: Patient declined    Frequency of Social Gatherings with Friends and Family: Patient declined    Attends Religious Services: Patient declined    Database administrator or Organizations: No    Attends Engineer, structural: Not on file    Marital Status: Patient declined  Intimate Partner Violence: Not on file   PHYSICAL EXAM  Vitals:   06/16/23 1550  BP: 122/60  Pulse: 75  Weight: 204 lb  (92.5 kg)  Height: 5\' 5"  (1.651 m)    Body mass index is 33.95 kg/m.  Generalized: Well developed, in no acute distress  Neurological examination  Mentation: Alert oriented to time, place, history taking. Follows all commands speech and language fluent Cranial nerve II-XII: Pupils were equal round reactive to light. Extraocular movements were full, visual field were full on confrontational test. Facial sensation and strength were normal.  Head turning and shoulder shrug  were normal and symmetric. Motor: The motor testing reveals 5 over 5 strength of all 4 extremities.  Good symmetric motor tone is noted throughout.  Sensory: Sensory testing is intact to soft touch on all 4 extremities. No evidence of extinction is noted.  Coordination: Cerebellar testing reveals good finger-nose-finger and heel-to-shin bilaterally.  Gait and station: Gait is normal.  Reflexes: Deep tendon reflexes are symmetric and normal bilaterally.   DIAGNOSTIC DATA (LABS, IMAGING, TESTING) - I reviewed patient records, labs, notes, testing and imaging myself where available.  Lab Results  Component Value Date   WBC 7.9 05/07/2023   HGB 13.7 05/07/2023   HCT 42.6 05/07/2023   MCV 91 05/07/2023   PLT 421 05/07/2023      Component Value Date/Time   NA 137 05/07/2023 1134   K 4.4 05/07/2023 1134   CL 102 05/07/2023 1134   CO2 24 05/07/2023 1134   GLUCOSE 71 05/07/2023 1134   GLUCOSE 96 09/21/2017 1504   BUN 14 05/07/2023 1134   CREATININE 0.71 05/07/2023 1134   CALCIUM 8.9 05/07/2023 1134   PROT 6.3 05/07/2023 1134   ALBUMIN 3.8 (L) 05/07/2023 1134   AST 21 05/07/2023 1134   ALT 15 05/07/2023 1134   ALKPHOS 63 05/07/2023 1134   BILITOT 0.3 05/07/2023 1134   GFRNONAA >60 09/21/2017 1504   GFRAA >60 09/21/2017 1504   Lab Results  Component Value Date   CHOL 196 05/07/2023   HDL 76 05/07/2023   LDLCALC 103 (H) 05/07/2023   TRIG 94 05/07/2023   CHOLHDL 2.6 05/07/2023   Lab Results  Component  Value Date   HGBA1C 4.8 05/07/2023   No results found for: "VITAMINB12" Lab Results  Component Value Date   TSH 2.040 05/07/2023   ASSESSMENT AND PLAN 47 y.o. year old female   1.  History of partial seizures 2.  Ablation of left temporal AV  malformation 2003 (seizures pre and post ablation, described as right sided sensory spells, lately trouble speaking, poor focus)  -Few recent small seizure type events, mostly in the setting of stress -Increase Keppra 1000 mg twice a day -Check Keppra level for baseline -Check EEG -Discussed [place] driving law is not to drive until seizure-free for 6 months  -Call for seizure events, follow-up 6 months or sooner if needed, will have her see Dr. Teresa Coombs to establish seizure care since Dr. Anne Jefferson has retired  Meds ordered this encounter  Medications   levETIRAcetam (KEPPRA) 1000 MG tablet    Sig: Take 1 tablet (1,000 mg total) by mouth 2 (two) times daily.    Dispense:  180 tablet    Refill:  3   Margie Ege, [place], DNP 06/16/2023, 3:54 PM [facility] [address] [place], [place] [address] [phone]

## 2023-06-16 ENCOUNTER — Ambulatory Visit: Payer: BC Managed Care – PPO | Admitting: Neurology

## 2023-06-16 ENCOUNTER — Encounter: Payer: Self-pay | Admitting: Neurology

## 2023-06-16 VITALS — BP 122/60 | HR 75 | Ht 65.0 in | Wt 204.0 lb

## 2023-06-16 DIAGNOSIS — Z8774 Personal history of (corrected) congenital malformations of heart and circulatory system: Secondary | ICD-10-CM | POA: Diagnosis not present

## 2023-06-16 DIAGNOSIS — R569 Unspecified convulsions: Secondary | ICD-10-CM | POA: Diagnosis not present

## 2023-06-16 MED ORDER — LEVETIRACETAM 1000 MG PO TABS: 1000.0000 mg | ORAL_TABLET | Freq: Two times a day (BID) | ORAL | 3 refills | Status: DC

## 2023-06-16 NOTE — Patient Instructions (Signed)
Increase Keppra 1000 mg twice daily Check EEG Check Keppra level  [place] driving law is no driving until seizure free 6 months  Call for seizures  Follow up in 6 months

## 2023-06-17 LAB — LEVETIRACETAM LEVEL: Levetiracetam Lvl: 10.7 ug/mL (ref 10.0–40.0)

## 2023-06-27 DIAGNOSIS — J028 Acute pharyngitis due to other specified organisms: Secondary | ICD-10-CM | POA: Diagnosis not present

## 2023-06-27 DIAGNOSIS — H6121 Impacted cerumen, right ear: Secondary | ICD-10-CM | POA: Diagnosis not present

## 2023-06-27 DIAGNOSIS — R059 Cough, unspecified: Secondary | ICD-10-CM | POA: Diagnosis not present

## 2023-07-02 ENCOUNTER — Ambulatory Visit: Payer: BC Managed Care – PPO | Admitting: Family Medicine

## 2023-07-02 ENCOUNTER — Encounter: Payer: Self-pay | Admitting: Family Medicine

## 2023-07-02 VITALS — BP 107/71 | HR 79 | Temp 98.5°F | Ht 65.0 in | Wt 202.8 lb

## 2023-07-02 DIAGNOSIS — H6121 Impacted cerumen, right ear: Secondary | ICD-10-CM

## 2023-07-02 DIAGNOSIS — J069 Acute upper respiratory infection, unspecified: Secondary | ICD-10-CM

## 2023-07-02 DIAGNOSIS — E66812 Obesity, class 2: Secondary | ICD-10-CM

## 2023-07-02 DIAGNOSIS — Z6835 Body mass index (BMI) 35.0-35.9, adult: Secondary | ICD-10-CM

## 2023-07-02 MED ORDER — AMOXICILLIN-POT CLAVULANATE 875-125 MG PO TABS: 1.0000 | ORAL_TABLET | Freq: Two times a day (BID) | ORAL | 0 refills | Status: AC

## 2023-07-02 MED ORDER — PREDNISONE 20 MG PO TABS: 40.0000 mg | ORAL_TABLET | Freq: Every day | ORAL | 0 refills | Status: AC

## 2023-07-02 NOTE — Progress Notes (Signed)
Acute Office Visit  Subjective:     Patient ID: Brandy Jefferson, female    DOB: [birth year], 47 y.o.   MRN: [id number]  Chief Complaint  Patient presents with   Cough    Cough This is a new problem. Episode onset: 12-13 days. The problem has been unchanged. The cough is Productive of sputum (clear-white). Associated symptoms include ear congestion, ear pain (right), nasal congestion, postnasal drip, a sore throat and shortness of breath (hard to catch breath with coughing fits). Pertinent negatives include no chills, fever, headaches or wheezing. She has tried OTC cough suppressant and prescription cough suppressant for the symptoms. The treatment provided no relief. There is no history of asthma, bronchitis, COPD or pneumonia.   She was seen at [facility] for her ear pain. She had ear wax impaction. They tried to flush it but were unable to. She has been using debrox.   She has started zepbound 2.5 mg without side effects so far. She is now due to increase to 5 mg. She has lost a few lbs since starting zepbound. Her appetite is decreased with zepbound but not severe enough that she isn't able to eat. She has lost a total of about 50 lbs.   Review of Systems  Constitutional:  Negative for chills and fever.  HENT:  Positive for ear pain (right), postnasal drip and sore throat.   Respiratory:  Positive for cough and shortness of breath (hard to catch breath with coughing fits). Negative for wheezing.   Neurological:  Negative for headaches.        Objective:    BP 107/71   Pulse 79   Temp 98.5 F (36.9 C) (Temporal)   Ht 5\' 5"  (1.651 m)   Wt 202 lb 12.8 oz (92 kg)   SpO2 97%   BMI 33.75 kg/m  Wt Readings from Last 3 Encounters:  07/02/23 202 lb 12.8 oz (92 kg)  06/16/23 204 lb (92.5 kg)  05/07/23 201 lb 2 oz (91.2 kg)     Physical Exam Vitals and nursing note reviewed.  Constitutional:      General: She is not in acute distress.    Appearance: She is obese. She is not  ill-appearing, toxic-appearing or diaphoretic.  HENT:     Head: Normocephalic and atraumatic.     Right Ear: External ear normal. There is impacted cerumen.     Left Ear: Ear canal and external ear normal. A middle ear effusion is present. Tympanic membrane is not perforated, erythematous, retracted or bulging.     Nose: Congestion present.     Right Sinus: No maxillary sinus tenderness or frontal sinus tenderness.     Left Sinus: No maxillary sinus tenderness or frontal sinus tenderness.     Mouth/Throat:     Mouth: Mucous membranes are moist.     Pharynx: Oropharynx is clear. Posterior oropharyngeal erythema present. No pharyngeal swelling, oropharyngeal exudate, uvula swelling or postnasal drip.     Tonsils: No tonsillar exudate. 1+ on the right. 1+ on the left.  Eyes:     General: No scleral icterus.       Right eye: No discharge.        Left eye: No discharge.     Conjunctiva/sclera: Conjunctivae normal.  Cardiovascular:     Rate and Rhythm: Normal rate and regular rhythm.     Heart sounds: Normal heart sounds. No murmur heard. Pulmonary:     Effort: Pulmonary effort is normal. No respiratory distress.  Breath sounds: Normal breath sounds. No wheezing, rhonchi or rales.  Musculoskeletal:     Right lower leg: No edema.     Left lower leg: No edema.  Skin:    General: Skin is warm and dry.  Neurological:     General: No focal deficit present.     Mental Status: She is alert and oriented to person, place, and time.  Psychiatric:        Mood and Affect: Mood normal.        Behavior: Behavior normal.    Ear Cerumen Removal  Date/Time: 07/02/2023 3:32 PM  Performed by: Gabriel Earing, FNP Authorized by: Gabriel Earing, FNP  Location details: right ear Patient tolerance: patient tolerated the procedure well with no immediate complications Comments: Normal TM visualized following procedure Procedure type: irrigation  Sedation: Patient sedated: no      No  results found for any visits on 07/02/23.      Assessment & Plan:   Brandy Jefferson was seen today for cough.  Diagnoses and all orders for this visit:  Class 2 severe obesity due to excess calories with serious comorbidity and body mass index (BMI) of 35.0 to 35.9 in adult (HCC) Increase tirzepatide to 5 mg as she has completed 4 weeks of 2.5 mg. She is tolerating this much better than wegovy so far. Prednisone may increase appetite temporarily.   URI, acute Will treat empirically with Augmentin as below for secondary bacterial infection. Prednisone burst for bronchitis.  -     predniSONE (DELTASONE) 20 MG tablet; Take 2 tablets (40 mg total) by mouth daily for 5 days. -     amoxicillin-clavulanate (AUGMENTIN) 875-125 MG tablet; Take 1 tablet by mouth 2 (two) times daily for 7 days.  Impacted cerumen of right ear Removed with irrigation today.  -     Ear Cerumen Removal   Return in about 6 weeks (around 08/13/2023) for medication follow up.  The patient indicates understanding of these issues and agrees with the plan.  Gabriel Earing, FNP

## 2023-07-26 ENCOUNTER — Ambulatory Visit: Payer: BC Managed Care – PPO | Admitting: Neurology

## 2023-07-26 DIAGNOSIS — R569 Unspecified convulsions: Secondary | ICD-10-CM

## 2023-07-26 NOTE — Procedures (Signed)
    History:  48 year old woman with seizure  EEG classification: Awake and drowsy  Duration: 30 minutes   Technical aspects: This EEG study was done with scalp electrodes positioned according to the 10-20 International system of electrode placement. Electrical activity was reviewed with band pass filter of 1-70Hz , sensitivity of 7 uV/mm, display speed of 46mm/sec with a 60Hz  notched filter applied as appropriate. EEG data were recorded continuously and digitally stored.   Description of the recording: The background rhythms of this recording consists of a fairly well modulated medium amplitude alpha rhythm of 10 Hz that is reactive to eye opening and closure. Present in the anterior head region is a 15-20 Hz beta activity. Photic stimulation was performed, did not show any abnormalities. Hyperventilation was also performed, did not show any abnormalities. Drowsiness was manifested by background fragmentation. No abnormal epileptiform discharges seen during this recording. There was no focal slowing. There were no electrographic seizure identified.   Abnormality: None   Impression: This is a normal EEG recorded while drowsy and awake. No evidence of interictal epileptiform discharges. Normal EEGs, however, do not rule out epilepsy.    Jaishawn Witzke, MD [facility]

## 2023-09-08 ENCOUNTER — Other Ambulatory Visit (HOSPITAL_COMMUNITY): Payer: Self-pay

## 2023-09-08 ENCOUNTER — Encounter: Payer: Self-pay | Admitting: Pharmacy Technician

## 2023-09-08 NOTE — Telephone Encounter (Signed)
 ERROR

## 2023-09-10 ENCOUNTER — Encounter: Payer: Self-pay | Admitting: Family Medicine

## 2023-09-10 ENCOUNTER — Ambulatory Visit: Payer: BC Managed Care – PPO | Admitting: Family Medicine

## 2023-09-10 VITALS — BP 107/74 | HR 67 | Temp 97.4°F | Ht 65.0 in | Wt 202.5 lb

## 2023-09-10 DIAGNOSIS — E66812 Obesity, class 2: Secondary | ICD-10-CM

## 2023-09-10 DIAGNOSIS — R600 Localized edema: Secondary | ICD-10-CM | POA: Insufficient documentation

## 2023-09-10 DIAGNOSIS — Z6839 Body mass index (BMI) 39.0-39.9, adult: Secondary | ICD-10-CM

## 2023-09-10 DIAGNOSIS — F411 Generalized anxiety disorder: Secondary | ICD-10-CM

## 2023-09-10 MED ORDER — HYDROCHLOROTHIAZIDE 12.5 MG PO TABS: 12.5000 mg | ORAL_TABLET | Freq: Every day | ORAL | 3 refills | Status: AC

## 2023-09-10 MED ORDER — ZEPBOUND 10 MG/0.5ML ~~LOC~~ SOAJ: 10.0000 mg | SUBCUTANEOUS | 0 refills | Status: DC

## 2023-09-10 MED ORDER — ZEPBOUND 7.5 MG/0.5ML ~~LOC~~ SOAJ: 7.5000 mg | SUBCUTANEOUS | 0 refills | Status: DC

## 2023-09-10 NOTE — Assessment & Plan Note (Signed)
 BMI now down to 33. Will titrate up zepbound to 7.5 mg x 4 weeks, then 10 mg x 4 weeks. Continue well balanced diet. She will increase physical activity as well.

## 2023-09-10 NOTE — Progress Notes (Signed)
 Established Patient Office Visit  Subjective   Patient ID: Brandy Jefferson, female    DOB: 11/30/1975  Age: 48 y.o. MRN: [id number]  Chief Complaint  Patient presents with   BMI    6 week follow up     HPI Brandy Jefferson is here for a weight management follow up. Has been on zepbound 5 mg for 6 weeks. No side effects. She has maintained weight since but has only lost a few ounces since December. It has been harder to stay active in the winter. She continues with an overall healthy diet. She does indulge in somes sweets in moderation from time to time.   She has been compliant with hydrochlorothiazide. Denies edema.       09/10/2023    8:48 AM 07/02/2023    3:12 PM 05/07/2023   11:13 AM  Depression screen PHQ 2/9  Decreased Interest 0 0 1  Down, Depressed, Hopeless 0 0 1  PHQ - 2 Score 0 0 2  Altered sleeping 0 0 0  Tired, decreased energy 1 0 0  Change in appetite 1 0 0  Feeling bad or failure about yourself  0 0 0  Trouble concentrating 0 0 0  Moving slowly or fidgety/restless 0 0 0  Suicidal thoughts 0 0 0  PHQ-9 Score 2 0 2  Difficult doing work/chores Not difficult at all Not difficult at all Not difficult at all      09/10/2023    8:48 AM 07/02/2023    3:12 PM 01/21/2023    3:34 PM 09/30/2022    3:58 PM  GAD 7 : Generalized Anxiety Score  Nervous, Anxious, on Edge 0 0 0 0  Control/stop worrying 0 0 0 1  Worry too much - different things 0 0 1 1  Trouble relaxing 0 0 0 0  Restless 0 0 0 0  Easily annoyed or irritable 1 0 1 1  Afraid - awful might happen 0 0 0 0  Total GAD 7 Score 1 0 2 3  Anxiety Difficulty Not difficult at all Not difficult at all Not difficult at all Not difficult at all       ROS As per HPI.    Objective:     BP 107/74   Pulse 67   Temp (!) 97.4 F (36.3 C)   Ht 5\' 5"  (1.651 m)   Wt 202 lb 8 oz (91.9 kg)   SpO2 100%   BMI 33.70 kg/m  Wt Readings from Last 3 Encounters:  09/10/23 202 lb 8 oz (91.9 kg)  07/02/23 202 lb 12.8 oz  (92 kg)  06/16/23 204 lb (92.5 kg)      Physical Exam Vitals and nursing note reviewed.  Constitutional:      General: She is not in acute distress.    Appearance: Normal appearance. She is not ill-appearing.  Cardiovascular:     Rate and Rhythm: Normal rate and regular rhythm.     Pulses: Normal pulses.     Heart sounds: Normal heart sounds. No murmur heard. Pulmonary:     Effort: Pulmonary effort is normal. No respiratory distress.     Breath sounds: Normal breath sounds.  Musculoskeletal:     Cervical back: Neck supple. No tenderness.     Right lower leg: No edema.     Left lower leg: No edema.  Lymphadenopathy:     Cervical: No cervical adenopathy.  Skin:    General: Skin is warm and dry.  Neurological:  General: No focal deficit present.     Mental Status: She is alert and oriented to person, place, and time.  Psychiatric:        Mood and Affect: Mood normal.        Behavior: Behavior normal.      No results found for any visits on 09/10/23.    The 10-year ASCVD risk score (Arnett DK, et al., 2019) is: 0.4%    Assessment & Plan:   Class 2 severe obesity due to excess calories with serious comorbidity and body mass index (BMI) of 39.0 to 39.9 in adult [facility]) Assessment & Plan: BMI now down to 33. Will titrate up zepbound to 7.5 mg x 4 weeks, then 10 mg x 4 weeks. Continue well balanced diet. She will increase physical activity as well.   Orders: -     Zepbound; Inject 7.5 mg into the skin once a week.  Dispense: 2 mL; Refill: 0 -     Zepbound; Inject 10 mg into the skin once a week.  Dispense: 2 mL; Refill: 0  Peripheral edema Assessment & Plan: Well controlled with hydrochlorothiazide.   Orders: -     hydroCHLOROthiazide; Take 1 tablet (12.5 mg total) by mouth daily.  Dispense: 90 tablet; Refill: 3  Generalized anxiety disorder Assessment & Plan: Well controlled without medication.      Return in about 8 weeks (around 11/05/2023) for medication  follow up.   The patient indicates understanding of these issues and agrees with the plan.  Gabriel Earing, FNP

## 2023-09-10 NOTE — Assessment & Plan Note (Signed)
 Well controlled without medication.

## 2023-09-10 NOTE — Assessment & Plan Note (Signed)
 Well controlled with hydrochlorothiazide.

## 2023-10-11 ENCOUNTER — Other Ambulatory Visit: Payer: Self-pay | Admitting: Family Medicine

## 2023-10-11 DIAGNOSIS — E66812 Obesity, class 2: Secondary | ICD-10-CM

## 2023-10-18 ENCOUNTER — Other Ambulatory Visit (HOSPITAL_COMMUNITY): Payer: Self-pay

## 2023-10-18 ENCOUNTER — Telehealth: Payer: Self-pay

## 2023-10-18 NOTE — Telephone Encounter (Signed)
*  Primary  Pharmacy Patient Advocate Encounter   Received notification from CoverMyMeds that prior authorization for Zepbound 10MG /0.5ML pen-injectors  is required/requested.   Insurance verification completed.   The patient is insured through [facility] .   Per test claim: PA required; PA submitted to above mentioned insurance via CoverMyMeds Key/confirmation #/EOC [id number] Status is pending

## 2023-10-19 ENCOUNTER — Other Ambulatory Visit: Payer: Self-pay | Admitting: Family Medicine

## 2023-10-19 ENCOUNTER — Telehealth: Payer: Self-pay | Admitting: Family Medicine

## 2023-10-19 NOTE — Telephone Encounter (Unsigned)
 Copied from CRM [id number]. Topic: Clinical - Prescription Issue >> Oct 19, 2023  3:09 PM Gery Pray wrote: Reason for CRM: Patient calling in regarding medication refill for ZEBOUND. Patient would like a callback regarding status of medication and reason it was denied. Patient can be contacted at [phone]

## 2023-10-20 ENCOUNTER — Other Ambulatory Visit (HOSPITAL_COMMUNITY): Payer: Self-pay

## 2023-10-20 NOTE — Telephone Encounter (Signed)
 Lmtcb advising pt she ntbs in office for [place] to get PA for zepbound.

## 2023-10-20 NOTE — Telephone Encounter (Signed)
 Received a fax for additional information about a prior authorization for Zepbound 10mg /0.14ml.  We will need the patient's current body weight (within the past 30 days) and if she has a diagnosis of moderate to severe obstructive sleep apnea   Thanks

## 2023-10-22 ENCOUNTER — Telehealth: Payer: Self-pay | Admitting: Family Medicine

## 2023-10-22 ENCOUNTER — Telehealth: Payer: Self-pay

## 2023-10-22 NOTE — Telephone Encounter (Signed)
 Pt has 3 telephone encounters regarding this, please see previous encounters.

## 2023-10-22 NOTE — Telephone Encounter (Signed)
 Copied from CRM [id number]. Topic: Clinical - Prescription Issue >> Oct 19, 2023  3:09 PM Brandy Jefferson wrote: Reason for CRM: Patient calling in regarding medication refill for Brandy Jefferson. Patient would like a callback regarding status of medication and reason it was denied. Patient can be contacted at [phone] >> Oct 22, 2023 11:53 AM Brandy Jefferson wrote: Pls call patient.. doesn't understand why she has to be seen for the zepbound.. [phone] She was seen in Feb she said

## 2023-10-22 NOTE — Telephone Encounter (Signed)
 Pt has 3 telephone encounters regarding this. Please see previous encounter.

## 2023-10-22 NOTE — Telephone Encounter (Signed)
 I spoke to pt and advised she has to be seen with weight in office in the past 30 days and pt voiced understanding. Offered appt today at 3:20 but pt had something to do and offered appts next week but pt needs something after 3 and I don't have anything available next week after 3. Pt states she will call back to try and schedule something.

## 2023-10-22 NOTE — Telephone Encounter (Signed)
 Copied from CRM [id number]. Topic: Clinical - Prescription Issue >> Oct 19, 2023  3:09 PM Gery Pray wrote: Reason for CRM: Patient calling in regarding medication refill for ZEBOUND. Patient would like a callback regarding status of medication and reason it was denied. Patient can be contacted at [phone]

## 2023-10-26 ENCOUNTER — Other Ambulatory Visit (HOSPITAL_COMMUNITY): Payer: Self-pay

## 2023-10-27 ENCOUNTER — Other Ambulatory Visit (HOSPITAL_COMMUNITY): Payer: Self-pay

## 2023-10-27 NOTE — Telephone Encounter (Signed)
 Left detailed message regarding the approval. LS

## 2023-10-27 NOTE — Telephone Encounter (Signed)
 Pharmacy Patient Advocate Encounter  Received notification from [facility] that Prior Authorization for Zepbound 10MG /0.5ML pen-injectors  has been APPROVED from 10/18/2023 to 10/17/2024. Ran test claim, Copay is $35.00/ 28 DAY SUPPLY. This test claim was processed through [facility]- copay amounts may vary at other pharmacies due to pharmacy/plan contracts, or as the patient moves through the different stages of their insurance plan.   PA #/Case ID/Reference #: [id number]

## 2023-11-10 ENCOUNTER — Ambulatory Visit: Admitting: Family Medicine

## 2023-11-10 VITALS — BP 126/92 | HR 85 | Temp 97.5°F | Ht 65.0 in | Wt 198.0 lb

## 2023-11-10 DIAGNOSIS — F411 Generalized anxiety disorder: Secondary | ICD-10-CM | POA: Diagnosis not present

## 2023-11-10 DIAGNOSIS — G43909 Migraine, unspecified, not intractable, without status migrainosus: Secondary | ICD-10-CM

## 2023-11-10 DIAGNOSIS — E66812 Obesity, class 2: Secondary | ICD-10-CM | POA: Diagnosis not present

## 2023-11-10 DIAGNOSIS — F330 Major depressive disorder, recurrent, mild: Secondary | ICD-10-CM | POA: Diagnosis not present

## 2023-11-10 DIAGNOSIS — Z6839 Body mass index (BMI) 39.0-39.9, adult: Secondary | ICD-10-CM

## 2023-11-10 NOTE — Progress Notes (Signed)
 Established Patient Office Visit  Subjective   Patient ID: Brandy Jefferson, female    DOB: [birth year]  Age: 48 y.o. MRN: [id number]  Chief Complaint  Patient presents with   Obesity    HPI Brandy Jefferson is here for a weight management follow up. Now on zepbound  10 mg for the last 2 weeks. Appetite is really decreased- struggles to eat the first few days after injection. Taking stool softener daily. Constipation has been worse over the last month with BM every 3-4 days despite daily stool softener and laxative prn. She stays active throughout the day. Climbing up and down ladders at work and on her feet, walking around for 8 hour shifts at work 6 days a week. She has a protein shake for breakfast. A light snack for lunch and meal for dinner.   Starting weight of 269.  Currently has had a headache for 2 days. Pressure around eyes with nausea and light sensitivity. She had failed advil and Excedrin migraine.      11/10/2023    3:25 PM 09/10/2023    8:48 AM 07/02/2023    3:12 PM  Depression screen PHQ 2/9  Decreased Interest 1 0 0  Down, Depressed, Hopeless 1 0 0  PHQ - 2 Score 2 0 0  Altered sleeping 1 0 0  Tired, decreased energy 1 1 0  Change in appetite 0 1 0  Feeling bad or failure about yourself  0 0 0  Trouble concentrating 0 0 0  Moving slowly or fidgety/restless 0 0 0  Suicidal thoughts 0 0 0  PHQ-9 Score 4 2 0  Difficult doing work/chores Not difficult at all Not difficult at all Not difficult at all      11/10/2023    3:26 PM 09/10/2023    8:48 AM 07/02/2023    3:12 PM 01/21/2023    3:34 PM  GAD 7 : Generalized Anxiety Score  Nervous, Anxious, on Edge 0 0 0 0  Control/stop worrying 0 0 0 0  Worry too much - different things 0 0 0 1  Trouble relaxing 0 0 0 0  Restless 0 0 0 0  Easily annoyed or irritable 1 1 0 1  Afraid - awful might happen 0 0 0 0  Total GAD 7 Score 1 1 0 2  Anxiety Difficulty Not difficult at all Not difficult at all Not difficult at all Not  difficult at all       ROS As per HPI.    Objective:     BP (!) 126/92   Pulse 85   Temp (!) 97.5 F (36.4 C) (Temporal)   Ht 5\' 5"  (1.651 m)   Wt 198 lb (89.8 kg)   SpO2 98%   BMI 32.95 kg/m  Wt Readings from Last 3 Encounters:  11/10/23 198 lb (89.8 kg)  09/10/23 202 lb 8 oz (91.9 kg)  07/02/23 202 lb 12.8 oz (92 kg)    Physical Exam Vitals and nursing note reviewed.  Constitutional:      General: She is not in acute distress.    Appearance: Normal appearance. She is not ill-appearing.  Cardiovascular:     Rate and Rhythm: Normal rate and regular rhythm.     Pulses: Normal pulses.     Heart sounds: Normal heart sounds. No murmur heard. Pulmonary:     Effort: Pulmonary effort is normal. No respiratory distress.     Breath sounds: Normal breath sounds.  Musculoskeletal:     Cervical back:  Neck supple. No tenderness.     Right lower leg: No edema.     Left lower leg: No edema.  Lymphadenopathy:     Cervical: No cervical adenopathy.  Skin:    General: Skin is warm and dry.  Neurological:     General: No focal deficit present.     Mental Status: She is alert and oriented to person, place, and time.  Psychiatric:        Mood and Affect: Mood normal.        Behavior: Behavior normal.      No results found for any visits on 11/10/23.    The 10-year ASCVD risk score (Arnett DK, et al., 2019) is: 0.6%    Assessment & Plan:   Shamelle was seen today for obesity.  Diagnoses and all orders for this visit:  Class 2 severe obesity due to excess calories with serious comorbidity and body mass index (BMI) of 39.0 to 39.9 in adult [facility]) She has lost a total of 71 lbs. Continue diet and exercise. Discussed adequte protein in diet. Discussed titration back down to 7.5 if she continues to struggle to eat enough, otherwise will continue to titrate up.   Acute migraine Nurtec samples given. If this is helpful, will sent in sumatriptan.    Anxiety Depression Stable without medication. Denies SI.   Return in about 3 months (around 02/09/2024) for chronic follow up.   The patient indicates understanding of these issues and agrees with the plan.  Brandy Huger, FNP

## 2023-11-11 ENCOUNTER — Encounter: Payer: Self-pay | Admitting: Family Medicine

## 2023-11-23 ENCOUNTER — Other Ambulatory Visit: Payer: Self-pay | Admitting: Family Medicine

## 2023-11-23 DIAGNOSIS — E66812 Obesity, class 2: Secondary | ICD-10-CM

## 2023-12-09 DIAGNOSIS — Z1231 Encounter for screening mammogram for malignant neoplasm of breast: Secondary | ICD-10-CM | POA: Diagnosis not present

## 2023-12-09 DIAGNOSIS — Z6833 Body mass index (BMI) 33.0-33.9, adult: Secondary | ICD-10-CM | POA: Diagnosis not present

## 2023-12-09 DIAGNOSIS — Z01419 Encounter for gynecological examination (general) (routine) without abnormal findings: Secondary | ICD-10-CM | POA: Diagnosis not present

## 2023-12-18 ENCOUNTER — Other Ambulatory Visit: Payer: Self-pay | Admitting: Neurology

## 2024-01-20 ENCOUNTER — Telehealth: Payer: Self-pay | Admitting: Neurology

## 2024-01-20 ENCOUNTER — Encounter: Payer: Self-pay | Admitting: Neurology

## 2024-01-20 ENCOUNTER — Ambulatory Visit: Payer: BC Managed Care – PPO | Admitting: Neurology

## 2024-01-20 VITALS — BP 128/86 | HR 70 | Resp 15 | Ht 65.0 in | Wt 196.5 lb

## 2024-01-20 DIAGNOSIS — G40009 Localization-related (focal) (partial) idiopathic epilepsy and epileptic syndromes with seizures of localized onset, not intractable, without status epilepticus: Secondary | ICD-10-CM | POA: Diagnosis not present

## 2024-01-20 DIAGNOSIS — Z5181 Encounter for therapeutic drug level monitoring: Secondary | ICD-10-CM | POA: Diagnosis not present

## 2024-01-20 DIAGNOSIS — Z8774 Personal history of (corrected) congenital malformations of heart and circulatory system: Secondary | ICD-10-CM | POA: Diagnosis not present

## 2024-01-20 MED ORDER — LEVETIRACETAM 1000 MG PO TABS: 1000.0000 mg | ORAL_TABLET | Freq: Two times a day (BID) | ORAL | 3 refills | Status: AC

## 2024-01-20 NOTE — Progress Notes (Signed)
 PATIENT: Brandy Jefferson DOB: [birth year]  REASON FOR VISIT: follow up for seizures  HISTORY FROM: patient PRIMARY NEUROLOGIST: Dr.Willis/Dr. Gregg   HISTORY OF PRESENT ILLNESS: Today 01/20/24  Patient presents today for follow-up, last visit was in December, at that time she was reporting seizures in the setting of medication nonadherence, Keppra  was increased to 1000 mg twice daily but she is still having occasional seizures in the setting of missing her evening dose.  These are her typical seizure where she is staring with inability to communicate.  Denies any generalized convulsion, denies any other major injury.   INTERVAL HISTORY 06/16/2023:  Remains on Keppra  500/1000 mg daily. Describes a few spells of seizure activity, where she can't speak, is still aware, is not focused, lasts less than 1 minute. Has been similar to prior seizure spells. Has missed few nighttime doses of Keppra . Unable to say if seizure spells correlated?  Seizure spells often during time of stress/anxiety.  She is raising her 18-year-old grandson.  Has lost 50 lbs with weight loss injection. Keppra  level was 22.5 in May 2024.   12/01/22 SS: Has lost 50 lbs with Wegovy . Remains on Keppra  500/1000 mg daily. Under stress with family. She is caregiver for grandson. Described events as lightheaded dizzy for few seconds then goes away, then cannot respond for < 1 minute., 5-10 within the last 6 months. At the time was missing doses of her Keppra . Is not back to taking regularly. She thinks seizure triggered by missing medication. She had warning sign before seizure of lightheaded, last was a few weeks ago. Works in a Producer, television/film/video, puts spoils on machine. Doesn't climb any heights.  11/27/21 SS: Brandy Jefferson Eth here today for follow-up.  Remains on Keppra  500/1000 mg daily.  No seizures, and around 6 years.  Her last seizure was triggered by missed dose of Keppra , brought on by bright lights.  Typical seizure event  described as hands go numb, vision is distorted, drooling, stare off.  Her seizure history preceded ablation of left temporal AV malformation.  She works full-time in Producer, television/film/video.  Has started an injectable weight loss drug. Has a 75 year old grandson.  Update 10/30/2020 DD:[id number] Brandy Jefferson with history of AV malformation in the left temporal area that was ablated.  Has history of seizures, on Keppra  500/1000 mg.  No recurrent seizures in around 5 years.  Continues to do well.  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  She is getting back on track with her follow-up appointments.  Presents today for evaluation unaccompanied.  Update 02/02/2019 SS: Brandy Jefferson is a 48 year old Jefferson with history of been arteriovenous malformation in the left temporal area that was ablated.  She has history of seizures and has done well taking Keppra  500 in the morning, 1000 mg in the evening. She is tolerating the medication well. Her last seizure was 3-4 years ago. Her overall health as been good. Her arthritis is acting up, especially in her right shoulder. She works in Water engineer. She is driving a car without difficulty. She is on phentermine  for weight loss.  She presents today for follow-up unaccompanied.  HISTORY 10/28/2017 Dr. Jenel: Brandy Jefferson is a 48 year old right-handed white Jefferson with a history of an arteriovenous malformation in the left temporal area that was ablated, she has a history of seizures, she has done well on Keppra  taking 500 mg in the morning and 1000 mg in the  evening.  The patient tolerates the medication well without drowsiness or irritability.  She reports no seizures since last seen about 2 years ago.  The patient operates a motor vehicle without difficulty.  She recently had a cyst removed from her left shoulder but otherwise no new medical problems have been noted.  She returns to this office for an evaluation.   REVIEW OF SYSTEMS: Out of a  complete 14 system review of symptoms, the patient complains only of the following symptoms, and all other reviewed systems are negative.  See HPI  ALLERGIES: No Known Allergies  HOME MEDICATIONS: Outpatient Medications Prior to Visit  Medication Sig Dispense Refill   cyclobenzaprine  (FLEXERIL ) 10 MG tablet Take 1 tablet (10 mg total) by mouth 3 (three) times daily as needed for muscle spasms. 60 tablet 0   hydrochlorothiazide  (HYDRODIURIL ) 12.5 MG tablet Take 1 tablet (12.5 mg total) by mouth daily. 90 tablet 3   norgestimate-ethinyl estradiol (ORTHO-CYCLEN,SPRINTEC,PREVIFEM) 0.25-35 MG-MCG tablet Take 1 tablet by mouth daily.     nystatin-triamcinolone  ointment (MYCOLOG) APPLY TO AFFECTED AREA TWICE A DAY     ondansetron  (ZOFRAN ) 4 MG tablet Take 1 tablet (4 mg total) by mouth every 8 (eight) hours as needed for nausea or vomiting. 20 tablet 1   tirzepatide  (ZEPBOUND ) 10 MG/0.5ML Pen INJECT 10 MG INTO THE SKIN ONE TIME PER WEEK 6 mL 0   levETIRAcetam  (KEPPRA ) 1000 MG tablet TAKE 1 TABLET BY MOUTH TWICE A DAY 180 tablet 3   No facility-administered medications prior to visit.    PAST MEDICAL HISTORY: Past Medical History:  Diagnosis Date   Arteriovenous malformation of brain    Left temporal   Partial seizure (HCC) 12/07/2012   Plantar fasciitis    both feet   Seizures (HCC)    Partial seizures, right hemisensory    PAST SURGICAL HISTORY: Past Surgical History:  Procedure Laterality Date   Arteriovenous malformation ablation  2004   Left temporal, intravascular procedure   MASS EXCISION N/A 09/29/2017   Procedure: EXCISION SEBACEOUS 2CM CYST ON BACK;  Surgeon: Kallie Manuelita BROCKS, MD;  Location: [facility];  Service: General;  Laterality: N/A;    FAMILY HISTORY: Family History  Problem Relation Age of Onset   Cancer Father     SOCIAL HISTORY: Social History   Socioeconomic History   Marital status: Single    Spouse name: Not on file   Number of children: 1   Years of  education: 12   Highest education level: 12th grade  Occupational History    Employer: [employer]  Tobacco Use   Smoking status: Never   Smokeless tobacco: Never  Vaping Use   Vaping status: Never Used  Substance and Sexual Activity   Alcohol use: No    Comment: Occasional   Drug use: No   Sexual activity: Yes    Birth control/protection: Pill  Other Topics Concern   Not on file  Social History Narrative   Patient lives at home with daughter and her daughters dad.    Patient is not married.    Patient has 1 child.    Patient is currently working.    Patient drinks about 5 cups of caffeine daily   Patient is right handed.      Social Drivers of Health   Financial Resource Strain: Patient Declined (06/30/2023)   Overall Financial Resource Strain (CARDIA)    Difficulty of Paying Living Expenses: Patient declined  Food Insecurity: Patient Declined (06/30/2023)   Hunger Vital  Sign    Worried About Programme researcher, broadcasting/film/video in the Last Year: Patient declined    Ran Out of Food in the Last Year: Patient declined  Transportation Needs: No Transportation Needs (06/30/2023)   PRAPARE - Administrator, Civil Service (Medical): No    Lack of Transportation (Non-Medical): No  Physical Activity: Unknown (06/30/2023)   Exercise Vital Sign    Days of Exercise per Week: Patient declined    Minutes of Exercise per Session: Not on file  Stress: No Stress Concern Present (06/30/2023)   [employer] of Occupational Health - Occupational Stress Questionnaire    Feeling of Stress : Only a little  Recent Concern: Stress - Stress Concern Present (05/06/2023)   [employer] of Occupational Health - Occupational Stress Questionnaire    Feeling of Stress : To some extent  Social Connections: Unknown (06/30/2023)   Social Connection and Isolation Panel    Frequency of Communication with Friends and Family: Patient declined    Frequency of Social Gatherings with Friends and  Family: Patient declined    Attends Religious Services: Patient declined    Database administrator or Organizations: No    Attends Engineer, structural: Not on file    Marital Status: Patient declined  Intimate Partner Violence: Not on file   PHYSICAL EXAM  Vitals:   01/20/24 1552  BP: 128/86  Pulse: 70  Resp: 15  SpO2: 98%  Weight: 196 lb 8 oz (89.1 kg)  Height: 5' 5 (1.651 m)    Body mass index is 32.7 kg/m.  Generalized: Well developed, in no acute distress  Neurological examination  Mentation: Alert oriented to time, place, history taking. Follows all commands speech and language fluent Cranial nerve II-XII: Pupils were equal round reactive to light. Extraocular movements were full, visual field were full on confrontational test. Facial sensation and strength were normal.  Head turning and shoulder shrug  were normal and symmetric. Motor: The motor testing reveals 5 over 5 strength of all 4 extremities.  Good symmetric motor tone is noted throughout.  Sensory: Sensory testing is intact to soft touch on all 4 extremities. No evidence of extinction is noted.  Coordination: Cerebellar testing reveals good finger-nose-finger and heel-to-shin bilaterally.  Gait and station: Gait is normal.  Reflexes: Deep tendon reflexes are symmetric and normal bilaterally.   DIAGNOSTIC DATA (LABS, IMAGING, TESTING) - I reviewed patient records, labs, notes, testing and imaging myself where available.  Lab Results  Component Value Date   WBC 7.9 05/07/2023   HGB 13.7 05/07/2023   HCT 42.6 05/07/2023   MCV 91 05/07/2023   PLT 421 05/07/2023      Component Value Date/Time   NA 137 05/07/2023 1134   K 4.4 05/07/2023 1134   CL 102 05/07/2023 1134   CO2 24 05/07/2023 1134   GLUCOSE 71 05/07/2023 1134   GLUCOSE 96 09/21/2017 1504   BUN 14 05/07/2023 1134   CREATININE 0.71 05/07/2023 1134   CALCIUM 8.9 05/07/2023 1134   PROT 6.3 05/07/2023 1134   ALBUMIN 3.8 (L) 05/07/2023  1134   AST 21 05/07/2023 1134   ALT 15 05/07/2023 1134   ALKPHOS 63 05/07/2023 1134   BILITOT 0.3 05/07/2023 1134   GFRNONAA >60 09/21/2017 1504   GFRAA >60 09/21/2017 1504   Lab Results  Component Value Date   CHOL 196 05/07/2023   HDL 76 05/07/2023   LDLCALC 103 (H) 05/07/2023   TRIG 94 05/07/2023   CHOLHDL  2.6 05/07/2023   Lab Results  Component Value Date   HGBA1C 4.8 05/07/2023   No results found for: [id number] Lab Results  Component Value Date   TSH 2.040 05/07/2023   ASSESSMENT AND PLAN 48 y.o. year old Jefferson   1.  Partial idiopathic epilepsy  2.  Ablation of left temporal AV malformation 2003 (seizures pre and post ablation, described as right sided sensory spells, lately trouble speaking, poor focus)  -Continue with Keppra  1000 mg twice a day -Check Keppra  level for baseline -Discussed [place]  driving law is not to drive until seizure-free for 6 months  -Call for seizure events, follow-up in 1 year or sooner if needed   Meds ordered this encounter  Medications   levETIRAcetam  (KEPPRA ) 1000 MG tablet    Sig: Take 1 tablet (1,000 mg total) by mouth 2 (two) times daily.    Dispense:  180 tablet    Refill:  3      Pastor Falling, MD 01/20/2024, 4:14 PM [facility] [address], [address] [place], [place] [address] [phone]

## 2024-01-20 NOTE — Telephone Encounter (Signed)
 Pt reports she is in route, she may be a few mins late.

## 2024-01-20 NOTE — Patient Instructions (Signed)
 Continue levetiracetam  1000 mg twice daily, refill given Will check levetiracetam  level today Continue your other medications Follow-up with Lauraine in 1 year or sooner if worse

## 2024-01-21 ENCOUNTER — Ambulatory Visit: Payer: Self-pay | Admitting: Neurology

## 2024-01-21 LAB — LEVETIRACETAM LEVEL: Levetiracetam Lvl: 16.1 ug/mL (ref 10.0–40.0)

## 2024-02-01 DIAGNOSIS — M76822 Posterior tibial tendinitis, left leg: Secondary | ICD-10-CM | POA: Diagnosis not present

## 2024-02-01 DIAGNOSIS — M79672 Pain in left foot: Secondary | ICD-10-CM | POA: Diagnosis not present
